# Patient Record
Sex: Female | Born: 1950
Health system: Southern US, Community
[De-identification: ages and names within clinical notes are randomized; demographics above are authoritative.]

## PROBLEM LIST (undated history)

## (undated) DIAGNOSIS — F419 Anxiety disorder, unspecified: Secondary | ICD-10-CM

## (undated) DIAGNOSIS — E119 Type 2 diabetes mellitus without complications: Secondary | ICD-10-CM

## (undated) HISTORY — PX: HAND SURGERY: SHX662

## (undated) HISTORY — PX: BACK SURGERY: SHX140

## (undated) HISTORY — PX: ELBOW SURGERY: SHX618

## (undated) HISTORY — DX: Type 2 diabetes mellitus without complications: E11.9

## (undated) HISTORY — DX: Anxiety disorder, unspecified: F41.9

---

## 2000-03-07 ENCOUNTER — Other Ambulatory Visit: Admission: RE | Admit: 2000-03-07 | Discharge: 2000-03-07 | Payer: Self-pay | Admitting: General Practice

## 2006-04-15 ENCOUNTER — Encounter (INDEPENDENT_AMBULATORY_CARE_PROVIDER_SITE_OTHER): Payer: Self-pay | Admitting: *Deleted

## 2006-04-15 ENCOUNTER — Inpatient Hospital Stay (HOSPITAL_COMMUNITY): Admission: RE | Admit: 2006-04-15 | Discharge: 2006-04-16 | Payer: Self-pay | Admitting: Orthopedic Surgery

## 2007-07-27 ENCOUNTER — Ambulatory Visit (HOSPITAL_COMMUNITY): Admission: RE | Admit: 2007-07-27 | Discharge: 2007-07-28 | Payer: Self-pay | Admitting: Neurosurgery

## 2010-06-05 NOTE — Op Note (Signed)
NAMEJESSIECA, RHEM                  ACCOUNT NO.:  [id number]   MEDICAL RECORD NO.:  [id number]          PATIENT TYPE:  AMB   LOCATION:  SDS                          FACILITY:  [facility]   PHYSICIAN:  Hewitt Shorts, M.D.DATE OF BIRTH:  05/28/1950   DATE OF PROCEDURE:  07/27/2007  DATE OF DISCHARGE:                               OPERATIVE REPORT   PREOPERATIVE DIAGNOSES:  Left L4-L5 lumbar disk herniation, lumbar  degenerative disc disease, lumbar stenosis, and lumbar radiculopathy.   POSTOPERATIVE DIAGNOSES:  Left L4-L5 lumbar disk herniation, lumbar  degenerative disc disease, lumbar stenosis, and lumbar radiculopathy.   PROCEDURE:  Left L4-L5 lumbar laminotomy and microdiscectomy with  microdissection.   SURGEON:  Hewitt Shorts, MD.   ASSISTANT:  1. Dr. Webb Silversmith, NP  2. Dr. Lovell Sheehan   ANESTHESIA:  General endotracheal.   INDICATIONS:  This is a 60 year old woman who presents with a left  lumbar radiculopathy.  An MRI revealed left L4-L5 lumbar disc herniation  with fragments that had migrated caudally behind the body of L5.  Decision was made to proceed with elective laminotomy and  microdiscectomy.   DESCRIPTION OF PROCEDURE:  The patient was brought to the operating  room, placed on the general endotracheal anesthesia.  The patient was  turned to a prone position.  Lumbar region was prepped with Betadine  soap and solution, and draped in a sterile fashion.  The midline was  infiltrated with local anesthetic with epinephrine, and then x-ray was  taken and the L4-L5 identified.  It should be noted that the patient had  a rudimentary S1-S2 disc, and a midline incision was made at the L4-L5  level.  The patient did have a previous midline incision, and the  incision was carried down through that previous incision.   The incision was carried down through the subcutaneous tissue.  Biopolar  cautery and electrocautery was used to maintain hemostasis.  Dissection  was carried  down through the lumbar fascia, which was incised in the  left side of the midline and paraspinal muscles were dissected from the  spinous process and lamina in a subperiosteal fashion.  An x-ray was  taken at the L4-L5, interlaminar space identified.  The microscope was  draped and brought to the field to provide additional navigation,  illumination, and visualization.  Laminotomy decompression was performed  using microdissection and microsurgical technique.   Laminotomy was performed using the X-Max drill and Kerrison punches.  The ligamentum flavum was carefully removed, and we identified the  thecal sac and exiting left L5 nerve root.  These structures were gently  mobilized medially and the disk herniation identified.  We were able to  mobilize the fragment and removed it in initial large fragment, and one  smaller fragment.  We then examined the epidural space and found no  additional fragments.  Repeat compression of the thecal sac and nerve  roots had been achieved.   We then examined the annulus of the L4-L5 disc, which appeared to have  healed over.  We did not find a rent in the annulus, and  therefore  although the disc was degenerated it was not significantly protruding  and was not causing any neural compression.  It was felt there would be  no further drainage, after excising the annulus and entering into the  disk space to proceed with further discectomy.  It was felt that the  decompression had been achieved by removing the free fragment.  We  therefore irrigated the wound with bacitracin solution.  Hemostasis was  established with the use of bipolar cautery.  Once hemostasis was  confirmed, we instilled 2 mL of fentanyl and 80 mg of Depo-Medrol into  the epidural space and proceeded with closure.  The deep fascia was  closed with interrupted undyed #1 Vicryl sutures.  The scarpas fascia  was closed with interrupted inverted 2-0 undyed Vicryl sutures.  The  subcutaneous  and subcuticular were closed with interrupted inverted 2-0  and 3-0 undyed Vicryl sutures.  Skin was reapproximated with Dermabond.  The procedure was tolerated well.  The estimated blood loss was less  than 10 mL.  Sponge and needle count were correct. Following surgery,  the patient was returned back to the supine position, reversed from the  anesthetic, extubated, and transferred to the recovery room for further  care.      Hewitt Shorts, M.D.  Electronically Signed     RWN/MEDQ  D:  07/27/2007  T:  07/28/2007  Job:  [id number]

## 2010-06-08 NOTE — Op Note (Signed)
Sandra Bryan, Sandra Bryan                  ACCOUNT NO.:  [id number]   MEDICAL RECORD NO.:  [id number]          PATIENT TYPE:  AMB   LOCATION:  DAY                          FACILITY:  [facility]   PHYSICIAN:  Marlowe Kays, M.D.  DATE OF BIRTH:  [birth year]   DATE OF PROCEDURE:  04/15/2006  DATE OF DISCHARGE:                               OPERATIVE REPORT   PREOPERATIVE DIAGNOSIS:  Herniated nucleus pulposis, L4-L5 central and  left.   POSTOPERATIVE DIAGNOSIS:  Herniated nucleus pulposis, L4-L5 central and  left.   OPERATION:  Microdiscectomy L4-L5 left.   SURGEON:  Marlowe Kays, M.D.   ASSISTANT:  Georges Lynch. Darrelyn Hillock, M.D.   ANESTHESIA:  General.   JUSTIFICATION FOR PROCEDURE:  The patient has had a history of non-  radiating low back pain in the past but developed low back and left leg  pain around March 03, 2006, and this became progressively more severe  leading to an MRI performed on March 10 which showed the above mentioned  pathology.  She has been in severe pain over the last few days with no  rest that despite a steroid injection at L4-L5 on the left and  consequently, she is here today for the above mentioned surgery.   PROCEDURE:  Prophylactic antibiotics, satisfactory general anesthesia,  knee chest position on the Andrews frame, the back was prepped with  DuraPrep and three spinal needles and a lateral x-ray.  I tentatively  localized the L4-L5 space.  I then completed draping the back in a  sterile field, Ioban employed.  I made a vertical midline incision and  tagged what I initially thought was the L4 spinous process. On further  dissection, it was unclear which was the L3-L4 and which was the L4-L5  interspace and consequently, we placed a Penfield 4 instrument where we  thought was probably L4-L5 and took a third lateral x-ray confirming  that we were at L4-L5 and were able to localize the disc space based on  this x-ray.  I then dissected the soft tissue off the  lamina at L4 and  L5 and placed a self-retaining McCullough retractor.  With a small  curet, I undermined the inferior portion of L4 and superior portion of  L5 and the 2 and 3 mm Kerrison rongeurs began removing bone and  ligamentum flavum. I then brought in the microscope and completed the  exposure removing ligamentum flavum and bone laterally.  The L5 nerve  root was identified and mobilized medially.  A large disc herniation was  noted beneath it.  I opened this with a 15 knife blade and one large  fragment immediately came forth.  I then removed at least three other  large fragments, with one being centrally.  We removed all disc material  obtainable with a combination of angled upbiting curet, straight and  angled upbiting pituitaries.  We then check with a hockey stick at both  L3-L4 and L4-L5 and both appeared to be patent.  A number of fairly  large veins were cauterized with the bipolar cautery.  The wound was dry  on closure.  I placed Gelfoam soaked in thrombin over the interspace and  about the L5 nerve root and dura.  The self-retaining retractors were  removed, there was no unusual bleeding.  I closed the fascia with  interrupted #1 Vicryl along with the deep subcutaneous tissue,  superficial subcutaneous tissue with 2-0 Vicryl, staples in the skin.  I  infiltrated the  subcutaneous tissue with 0.5% Marcaine plain and she was given 30 mg of  Toradol IV.  A Betadine, Adaptic, dry sterile dressing were applied.  She was gently placed on her PACU bed and taken to the PACU in  satisfactory condition with no known complications.           ______________________________  Marlowe Kays, M.D.     JA/MEDQ  D:  04/15/2006  T:  04/15/2006  Job:  [id number]

## 2010-10-18 LAB — BASIC METABOLIC PANEL
BUN: 15
CO2: 28
Calcium: 9.7
Chloride: 105
Creatinine, Ser: 0.77
GFR calc Af Amer: >60
GFR calc non Af Amer: >60
Glucose, Bld: 82
Potassium: 4.4
Sodium: 137

## 2010-10-18 LAB — CBC
HCT: 41.4
Hemoglobin: 14.3
MCHC: 34.4
MCV: 93.1
Platelets: 257
RBC: 4.45
RDW: 12.9
WBC: 10.2

## 2013-06-11 DIAGNOSIS — D0512 Intraductal carcinoma in situ of left breast: Secondary | ICD-10-CM | POA: Insufficient documentation

## 2013-09-16 ENCOUNTER — Ambulatory Visit (INDEPENDENT_AMBULATORY_CARE_PROVIDER_SITE_OTHER): Payer: BC Managed Care – PPO

## 2013-09-16 VITALS — BP 126/87 | HR 83 | Resp 18

## 2013-09-16 DIAGNOSIS — L600 Ingrowing nail: Secondary | ICD-10-CM

## 2013-09-16 DIAGNOSIS — B351 Tinea unguium: Secondary | ICD-10-CM

## 2013-09-16 DIAGNOSIS — L03039 Cellulitis of unspecified toe: Secondary | ICD-10-CM

## 2013-09-16 MED ORDER — CEPHALEXIN 500 MG PO CAPS: 500.0000 mg | ORAL_CAPSULE | Freq: Three times a day (TID) | ORAL | Status: DC

## 2013-09-16 NOTE — Progress Notes (Signed)
   Subjective:    Patient ID: Sandra Bryan, female    DOB: September 15, 1950, 63 y.o.   MRN: [id number]  HPI I AM HAVING SOME TROUBLE WITH THIS INGROWN TOENAIL ON MY LEFT BIG TOE AND I DID GET A PEDICURE AND I HAVE DUG AT IT AND HAS BEEN GOING ON FOR ABOUT 3 DAYS AND THERE IS NO DRAINING AND HURTS REAL BAD AND DOES THROB AND IS SORE AND TENDER AND IS TOUCHY     Review of Systems  Constitutional: Positive for unexpected weight change.  All other systems reviewed and are negative.      Objective:   Physical Exam 63 year old white female well-developed well-nourished oriented x3 presents this time vital signs stable is currently being treated radiation therapy for breast cancer is doing well patient is having pain and infection of her left great toe producing her right great toenail removed the similar problem on the left great toe is thickened yellow criptotic incurvated and painful on direct lateral compression. There may be some evidence of drainage from beneath the nail plate.  Lower extremity objective findings as follows vascular status is intact pedal pulses palpable DP +2/4 PT +2/4 bilateral refill timed 3-4 seconds epicritic and proprioceptive sensations intact and symmetric bilateral normal plantar response DTRs not listed dermatologic the skin color pigment normal hair growth absent nails criptotic right hallux previous in both left hallux with glucose incurvation subungual debris and posse some drainage erythema of the nail folds are noted going on for about 3 or 4 days has had problems with this in the past am a lot since also had a pedicure which may have aggravated this recently. Sore to touch and painful with enclosed shoes. Orthopedic biomechanical exam otherwise unremarkable noncontributory no open wounds or ulcers are degrees noted no ascending cellulitis or lymphangitis noted       Assessment & Plan:  Assessment this time history of ingrowing nail with paronychia and likely  onychomycosis of the left great toenail local anesthetic administered Betadine prep performed the nail plate is avulsed phenol matricectomy followed by alcohol wash Betadine and dressed with dressing but patient how the procedure was given instructions for soaks and Betadine or Epsom salts prescription for cephalexin is furnished recommended Tylenol or Advil as needed for pain recheck in 2-3 weeks for followup contact us in changes or exacerbations were to occur in the interim  Harriet Masson DPM

## 2013-09-16 NOTE — Patient Instructions (Signed)

## 2013-09-16 NOTE — Addendum Note (Signed)
Addended by: Cranford Mon R on: 09/16/2013 01:28 PM   Modules accepted: Orders

## 2013-09-19 LAB — CULTURE, ROUTINE-ABSCESS: Gram Stain: NONE SEEN

## 2013-09-30 ENCOUNTER — Ambulatory Visit: Payer: BC Managed Care – PPO

## 2014-11-10 DIAGNOSIS — K5900 Constipation, unspecified: Secondary | ICD-10-CM

## 2014-11-10 DIAGNOSIS — Z853 Personal history of malignant neoplasm of breast: Secondary | ICD-10-CM

## 2015-04-21 ENCOUNTER — Other Ambulatory Visit: Payer: Self-pay | Admitting: Orthopaedic Surgery

## 2015-04-21 DIAGNOSIS — M5136 Other intervertebral disc degeneration, lumbar region: Secondary | ICD-10-CM

## 2015-04-28 ENCOUNTER — Inpatient Hospital Stay: Admission: RE | Admit: 2015-04-28 | Payer: Self-pay | Source: Ambulatory Visit

## 2015-08-01 DIAGNOSIS — D0512 Intraductal carcinoma in situ of left breast: Secondary | ICD-10-CM | POA: Diagnosis not present

## 2015-08-01 DIAGNOSIS — Z86000 Personal history of in-situ neoplasm of breast: Secondary | ICD-10-CM | POA: Diagnosis not present

## 2015-09-14 DIAGNOSIS — L309 Dermatitis, unspecified: Secondary | ICD-10-CM | POA: Diagnosis not present

## 2015-09-14 DIAGNOSIS — Z13820 Encounter for screening for osteoporosis: Secondary | ICD-10-CM | POA: Diagnosis not present

## 2015-09-14 DIAGNOSIS — M859 Disorder of bone density and structure, unspecified: Secondary | ICD-10-CM | POA: Diagnosis not present

## 2015-09-14 DIAGNOSIS — M8580 Other specified disorders of bone density and structure, unspecified site: Secondary | ICD-10-CM | POA: Diagnosis not present

## 2015-09-14 DIAGNOSIS — E7800 Pure hypercholesterolemia, unspecified: Secondary | ICD-10-CM | POA: Diagnosis not present

## 2015-09-14 DIAGNOSIS — F418 Other specified anxiety disorders: Secondary | ICD-10-CM | POA: Diagnosis not present

## 2015-09-14 DIAGNOSIS — E119 Type 2 diabetes mellitus without complications: Secondary | ICD-10-CM | POA: Diagnosis not present

## 2015-09-14 DIAGNOSIS — Z23 Encounter for immunization: Secondary | ICD-10-CM | POA: Diagnosis not present

## 2015-09-14 DIAGNOSIS — Z9181 History of falling: Secondary | ICD-10-CM | POA: Diagnosis not present

## 2015-09-14 DIAGNOSIS — Z1389 Encounter for screening for other disorder: Secondary | ICD-10-CM | POA: Diagnosis not present

## 2015-09-26 DIAGNOSIS — L219 Seborrheic dermatitis, unspecified: Secondary | ICD-10-CM | POA: Diagnosis not present

## 2015-09-26 DIAGNOSIS — L299 Pruritus, unspecified: Secondary | ICD-10-CM | POA: Diagnosis not present

## 2015-09-26 DIAGNOSIS — L300 Nummular dermatitis: Secondary | ICD-10-CM | POA: Diagnosis not present

## 2016-01-01 DIAGNOSIS — E119 Type 2 diabetes mellitus without complications: Secondary | ICD-10-CM | POA: Diagnosis not present

## 2016-01-01 DIAGNOSIS — M24571 Contracture, right ankle: Secondary | ICD-10-CM | POA: Diagnosis not present

## 2016-01-01 DIAGNOSIS — M722 Plantar fascial fibromatosis: Secondary | ICD-10-CM | POA: Diagnosis not present

## 2016-01-03 DIAGNOSIS — E7800 Pure hypercholesterolemia, unspecified: Secondary | ICD-10-CM | POA: Diagnosis not present

## 2016-01-03 DIAGNOSIS — E119 Type 2 diabetes mellitus without complications: Secondary | ICD-10-CM | POA: Diagnosis not present

## 2016-01-03 DIAGNOSIS — F418 Other specified anxiety disorders: Secondary | ICD-10-CM | POA: Diagnosis not present

## 2016-01-03 DIAGNOSIS — K219 Gastro-esophageal reflux disease without esophagitis: Secondary | ICD-10-CM | POA: Diagnosis not present

## 2016-01-29 DIAGNOSIS — H25813 Combined forms of age-related cataract, bilateral: Secondary | ICD-10-CM | POA: Diagnosis not present

## 2016-01-29 DIAGNOSIS — E119 Type 2 diabetes mellitus without complications: Secondary | ICD-10-CM | POA: Diagnosis not present

## 2016-01-29 DIAGNOSIS — H524 Presbyopia: Secondary | ICD-10-CM | POA: Diagnosis not present

## 2016-01-29 DIAGNOSIS — H1851 Endothelial corneal dystrophy: Secondary | ICD-10-CM | POA: Diagnosis not present

## 2016-01-29 DIAGNOSIS — H5203 Hypermetropia, bilateral: Secondary | ICD-10-CM | POA: Diagnosis not present

## 2016-01-29 DIAGNOSIS — H52223 Regular astigmatism, bilateral: Secondary | ICD-10-CM | POA: Diagnosis not present

## 2016-01-29 DIAGNOSIS — Z7984 Long term (current) use of oral hypoglycemic drugs: Secondary | ICD-10-CM | POA: Diagnosis not present

## 2016-02-05 DIAGNOSIS — Z86000 Personal history of in-situ neoplasm of breast: Secondary | ICD-10-CM | POA: Diagnosis not present

## 2016-03-01 DIAGNOSIS — E119 Type 2 diabetes mellitus without complications: Secondary | ICD-10-CM | POA: Diagnosis not present

## 2016-03-29 DIAGNOSIS — E119 Type 2 diabetes mellitus without complications: Secondary | ICD-10-CM | POA: Diagnosis not present

## 2016-03-29 DIAGNOSIS — I10 Essential (primary) hypertension: Secondary | ICD-10-CM | POA: Diagnosis not present

## 2016-04-29 DIAGNOSIS — C50112 Malignant neoplasm of central portion of left female breast: Secondary | ICD-10-CM | POA: Diagnosis not present

## 2016-04-29 DIAGNOSIS — R928 Other abnormal and inconclusive findings on diagnostic imaging of breast: Secondary | ICD-10-CM | POA: Diagnosis not present

## 2016-04-29 DIAGNOSIS — Z9889 Other specified postprocedural states: Secondary | ICD-10-CM | POA: Diagnosis not present

## 2016-07-05 DIAGNOSIS — G8929 Other chronic pain: Secondary | ICD-10-CM | POA: Diagnosis not present

## 2016-07-05 DIAGNOSIS — E119 Type 2 diabetes mellitus without complications: Secondary | ICD-10-CM | POA: Diagnosis not present

## 2016-07-05 DIAGNOSIS — I10 Essential (primary) hypertension: Secondary | ICD-10-CM | POA: Diagnosis not present

## 2016-07-05 DIAGNOSIS — M542 Cervicalgia: Secondary | ICD-10-CM | POA: Diagnosis not present

## 2016-08-05 DIAGNOSIS — Z86000 Personal history of in-situ neoplasm of breast: Secondary | ICD-10-CM | POA: Diagnosis not present

## 2016-08-05 DIAGNOSIS — Z170 Estrogen receptor positive status [ER+]: Secondary | ICD-10-CM | POA: Diagnosis not present

## 2016-08-05 DIAGNOSIS — Z923 Personal history of irradiation: Secondary | ICD-10-CM | POA: Diagnosis not present

## 2016-08-12 DIAGNOSIS — E119 Type 2 diabetes mellitus without complications: Secondary | ICD-10-CM | POA: Diagnosis not present

## 2016-08-12 DIAGNOSIS — Z0000 Encounter for general adult medical examination without abnormal findings: Secondary | ICD-10-CM | POA: Diagnosis not present

## 2016-08-12 DIAGNOSIS — R079 Chest pain, unspecified: Secondary | ICD-10-CM | POA: Diagnosis not present

## 2016-08-12 DIAGNOSIS — I10 Essential (primary) hypertension: Secondary | ICD-10-CM | POA: Diagnosis not present

## 2016-08-12 DIAGNOSIS — Z6835 Body mass index (BMI) 35.0-35.9, adult: Secondary | ICD-10-CM | POA: Diagnosis not present

## 2016-08-14 DIAGNOSIS — E119 Type 2 diabetes mellitus without complications: Secondary | ICD-10-CM | POA: Diagnosis not present

## 2016-08-19 DIAGNOSIS — H1851 Endothelial corneal dystrophy: Secondary | ICD-10-CM | POA: Diagnosis not present

## 2016-08-19 DIAGNOSIS — H25813 Combined forms of age-related cataract, bilateral: Secondary | ICD-10-CM | POA: Diagnosis not present

## 2016-09-19 DIAGNOSIS — E119 Type 2 diabetes mellitus without complications: Secondary | ICD-10-CM | POA: Diagnosis not present

## 2016-09-19 DIAGNOSIS — Z6834 Body mass index (BMI) 34.0-34.9, adult: Secondary | ICD-10-CM | POA: Diagnosis not present

## 2016-09-19 DIAGNOSIS — Z9181 History of falling: Secondary | ICD-10-CM | POA: Diagnosis not present

## 2016-09-19 DIAGNOSIS — Z23 Encounter for immunization: Secondary | ICD-10-CM | POA: Diagnosis not present

## 2016-10-16 DIAGNOSIS — L300 Nummular dermatitis: Secondary | ICD-10-CM | POA: Diagnosis not present

## 2016-12-20 DIAGNOSIS — Z1331 Encounter for screening for depression: Secondary | ICD-10-CM | POA: Diagnosis not present

## 2016-12-20 DIAGNOSIS — Z6835 Body mass index (BMI) 35.0-35.9, adult: Secondary | ICD-10-CM | POA: Diagnosis not present

## 2016-12-20 DIAGNOSIS — Z1339 Encounter for screening examination for other mental health and behavioral disorders: Secondary | ICD-10-CM | POA: Diagnosis not present

## 2016-12-20 DIAGNOSIS — Z23 Encounter for immunization: Secondary | ICD-10-CM | POA: Diagnosis not present

## 2016-12-20 DIAGNOSIS — E119 Type 2 diabetes mellitus without complications: Secondary | ICD-10-CM | POA: Diagnosis not present

## 2016-12-20 DIAGNOSIS — E7800 Pure hypercholesterolemia, unspecified: Secondary | ICD-10-CM | POA: Diagnosis not present

## 2016-12-20 DIAGNOSIS — F418 Other specified anxiety disorders: Secondary | ICD-10-CM | POA: Diagnosis not present

## 2016-12-20 DIAGNOSIS — M25522 Pain in left elbow: Secondary | ICD-10-CM | POA: Diagnosis not present

## 2017-01-01 DIAGNOSIS — M7712 Lateral epicondylitis, left elbow: Secondary | ICD-10-CM | POA: Diagnosis not present

## 2017-01-30 DIAGNOSIS — H40053 Ocular hypertension, bilateral: Secondary | ICD-10-CM | POA: Diagnosis not present

## 2017-01-30 DIAGNOSIS — Z0100 Encounter for examination of eyes and vision without abnormal findings: Secondary | ICD-10-CM | POA: Diagnosis not present

## 2017-01-30 DIAGNOSIS — H5203 Hypermetropia, bilateral: Secondary | ICD-10-CM | POA: Diagnosis not present

## 2017-02-04 DIAGNOSIS — M25561 Pain in right knee: Secondary | ICD-10-CM | POA: Diagnosis not present

## 2017-02-11 DIAGNOSIS — E119 Type 2 diabetes mellitus without complications: Secondary | ICD-10-CM | POA: Diagnosis not present

## 2017-02-11 DIAGNOSIS — I10 Essential (primary) hypertension: Secondary | ICD-10-CM | POA: Diagnosis not present

## 2017-02-11 DIAGNOSIS — Z1211 Encounter for screening for malignant neoplasm of colon: Secondary | ICD-10-CM | POA: Diagnosis not present

## 2017-02-11 DIAGNOSIS — Z6835 Body mass index (BMI) 35.0-35.9, adult: Secondary | ICD-10-CM | POA: Diagnosis not present

## 2017-02-11 DIAGNOSIS — Z79899 Other long term (current) drug therapy: Secondary | ICD-10-CM | POA: Diagnosis not present

## 2017-02-11 DIAGNOSIS — E7800 Pure hypercholesterolemia, unspecified: Secondary | ICD-10-CM | POA: Diagnosis not present

## 2017-02-11 DIAGNOSIS — Z1331 Encounter for screening for depression: Secondary | ICD-10-CM | POA: Diagnosis not present

## 2017-02-12 DIAGNOSIS — M25561 Pain in right knee: Secondary | ICD-10-CM | POA: Diagnosis not present

## 2017-02-12 DIAGNOSIS — M1711 Unilateral primary osteoarthritis, right knee: Secondary | ICD-10-CM | POA: Diagnosis not present

## 2017-03-17 DIAGNOSIS — E119 Type 2 diabetes mellitus without complications: Secondary | ICD-10-CM | POA: Diagnosis not present

## 2017-03-17 DIAGNOSIS — Z6834 Body mass index (BMI) 34.0-34.9, adult: Secondary | ICD-10-CM | POA: Diagnosis not present

## 2017-03-19 DIAGNOSIS — Z9181 History of falling: Secondary | ICD-10-CM | POA: Diagnosis not present

## 2017-03-19 DIAGNOSIS — Z1331 Encounter for screening for depression: Secondary | ICD-10-CM | POA: Diagnosis not present

## 2017-03-19 DIAGNOSIS — E669 Obesity, unspecified: Secondary | ICD-10-CM | POA: Diagnosis not present

## 2017-03-19 DIAGNOSIS — Z6835 Body mass index (BMI) 35.0-35.9, adult: Secondary | ICD-10-CM | POA: Diagnosis not present

## 2017-03-19 DIAGNOSIS — Z136 Encounter for screening for cardiovascular disorders: Secondary | ICD-10-CM | POA: Diagnosis not present

## 2017-03-19 DIAGNOSIS — Z0000 Encounter for general adult medical examination without abnormal findings: Secondary | ICD-10-CM | POA: Diagnosis not present

## 2017-03-19 DIAGNOSIS — E785 Hyperlipidemia, unspecified: Secondary | ICD-10-CM | POA: Diagnosis not present

## 2017-03-19 DIAGNOSIS — Z1231 Encounter for screening mammogram for malignant neoplasm of breast: Secondary | ICD-10-CM | POA: Diagnosis not present

## 2017-03-19 DIAGNOSIS — N959 Unspecified menopausal and perimenopausal disorder: Secondary | ICD-10-CM | POA: Diagnosis not present

## 2017-03-19 DIAGNOSIS — Z1211 Encounter for screening for malignant neoplasm of colon: Secondary | ICD-10-CM | POA: Diagnosis not present

## 2017-03-27 DIAGNOSIS — H40053 Ocular hypertension, bilateral: Secondary | ICD-10-CM | POA: Diagnosis not present

## 2017-04-30 DIAGNOSIS — Z6834 Body mass index (BMI) 34.0-34.9, adult: Secondary | ICD-10-CM | POA: Diagnosis not present

## 2017-04-30 DIAGNOSIS — E119 Type 2 diabetes mellitus without complications: Secondary | ICD-10-CM | POA: Diagnosis not present

## 2017-04-30 DIAGNOSIS — R109 Unspecified abdominal pain: Secondary | ICD-10-CM | POA: Diagnosis not present

## 2017-05-01 DIAGNOSIS — N281 Cyst of kidney, acquired: Secondary | ICD-10-CM | POA: Diagnosis not present

## 2017-05-01 DIAGNOSIS — C50112 Malignant neoplasm of central portion of left female breast: Secondary | ICD-10-CM | POA: Diagnosis not present

## 2017-05-01 DIAGNOSIS — R109 Unspecified abdominal pain: Secondary | ICD-10-CM | POA: Diagnosis not present

## 2017-05-01 DIAGNOSIS — K760 Fatty (change of) liver, not elsewhere classified: Secondary | ICD-10-CM | POA: Diagnosis not present

## 2017-05-01 DIAGNOSIS — N200 Calculus of kidney: Secondary | ICD-10-CM | POA: Diagnosis not present

## 2017-05-08 DIAGNOSIS — R928 Other abnormal and inconclusive findings on diagnostic imaging of breast: Secondary | ICD-10-CM | POA: Diagnosis not present

## 2017-05-20 DIAGNOSIS — F329 Major depressive disorder, single episode, unspecified: Secondary | ICD-10-CM | POA: Diagnosis not present

## 2017-05-20 DIAGNOSIS — I10 Essential (primary) hypertension: Secondary | ICD-10-CM | POA: Diagnosis not present

## 2017-05-20 DIAGNOSIS — E119 Type 2 diabetes mellitus without complications: Secondary | ICD-10-CM | POA: Diagnosis not present

## 2017-05-20 DIAGNOSIS — E7800 Pure hypercholesterolemia, unspecified: Secondary | ICD-10-CM | POA: Diagnosis not present

## 2017-05-22 ENCOUNTER — Encounter: Payer: Self-pay | Admitting: Nurse Practitioner

## 2017-05-30 DIAGNOSIS — Z86000 Personal history of in-situ neoplasm of breast: Secondary | ICD-10-CM | POA: Diagnosis not present

## 2017-06-02 DIAGNOSIS — L603 Nail dystrophy: Secondary | ICD-10-CM | POA: Diagnosis not present

## 2017-06-02 DIAGNOSIS — L600 Ingrowing nail: Secondary | ICD-10-CM | POA: Diagnosis not present

## 2017-06-11 DIAGNOSIS — L603 Nail dystrophy: Secondary | ICD-10-CM | POA: Diagnosis not present

## 2017-06-11 DIAGNOSIS — L600 Ingrowing nail: Secondary | ICD-10-CM | POA: Diagnosis not present

## 2017-06-25 DIAGNOSIS — L600 Ingrowing nail: Secondary | ICD-10-CM | POA: Diagnosis not present

## 2017-08-06 DIAGNOSIS — L739 Follicular disorder, unspecified: Secondary | ICD-10-CM | POA: Diagnosis not present

## 2017-08-06 DIAGNOSIS — K219 Gastro-esophageal reflux disease without esophagitis: Secondary | ICD-10-CM | POA: Diagnosis not present

## 2017-08-06 DIAGNOSIS — E785 Hyperlipidemia, unspecified: Secondary | ICD-10-CM | POA: Diagnosis not present

## 2017-08-06 DIAGNOSIS — F329 Major depressive disorder, single episode, unspecified: Secondary | ICD-10-CM | POA: Diagnosis not present

## 2017-08-06 DIAGNOSIS — E119 Type 2 diabetes mellitus without complications: Secondary | ICD-10-CM | POA: Diagnosis not present

## 2017-08-22 DIAGNOSIS — F329 Major depressive disorder, single episode, unspecified: Secondary | ICD-10-CM | POA: Diagnosis not present

## 2017-08-22 DIAGNOSIS — I10 Essential (primary) hypertension: Secondary | ICD-10-CM | POA: Diagnosis not present

## 2017-08-22 DIAGNOSIS — E119 Type 2 diabetes mellitus without complications: Secondary | ICD-10-CM | POA: Diagnosis not present

## 2017-08-22 DIAGNOSIS — Z79899 Other long term (current) drug therapy: Secondary | ICD-10-CM | POA: Diagnosis not present

## 2017-08-22 DIAGNOSIS — E7800 Pure hypercholesterolemia, unspecified: Secondary | ICD-10-CM | POA: Diagnosis not present

## 2017-10-03 DIAGNOSIS — M546 Pain in thoracic spine: Secondary | ICD-10-CM | POA: Diagnosis not present

## 2017-10-03 DIAGNOSIS — E119 Type 2 diabetes mellitus without complications: Secondary | ICD-10-CM | POA: Diagnosis not present

## 2017-10-03 DIAGNOSIS — F419 Anxiety disorder, unspecified: Secondary | ICD-10-CM | POA: Diagnosis not present

## 2017-10-03 DIAGNOSIS — Z87891 Personal history of nicotine dependence: Secondary | ICD-10-CM | POA: Diagnosis not present

## 2017-10-06 DIAGNOSIS — M546 Pain in thoracic spine: Secondary | ICD-10-CM | POA: Diagnosis not present

## 2017-10-06 DIAGNOSIS — M4184 Other forms of scoliosis, thoracic region: Secondary | ICD-10-CM | POA: Diagnosis not present

## 2017-10-15 DIAGNOSIS — H40053 Ocular hypertension, bilateral: Secondary | ICD-10-CM | POA: Diagnosis not present

## 2017-10-15 DIAGNOSIS — H25813 Combined forms of age-related cataract, bilateral: Secondary | ICD-10-CM | POA: Diagnosis not present

## 2017-11-03 DIAGNOSIS — Z23 Encounter for immunization: Secondary | ICD-10-CM | POA: Diagnosis not present

## 2017-11-03 DIAGNOSIS — E119 Type 2 diabetes mellitus without complications: Secondary | ICD-10-CM | POA: Diagnosis not present

## 2017-11-03 DIAGNOSIS — F419 Anxiety disorder, unspecified: Secondary | ICD-10-CM | POA: Diagnosis not present

## 2017-11-03 DIAGNOSIS — Z6834 Body mass index (BMI) 34.0-34.9, adult: Secondary | ICD-10-CM | POA: Diagnosis not present

## 2017-11-26 DIAGNOSIS — I10 Essential (primary) hypertension: Secondary | ICD-10-CM | POA: Diagnosis not present

## 2017-11-26 DIAGNOSIS — F419 Anxiety disorder, unspecified: Secondary | ICD-10-CM | POA: Diagnosis not present

## 2017-11-26 DIAGNOSIS — E119 Type 2 diabetes mellitus without complications: Secondary | ICD-10-CM | POA: Diagnosis not present

## 2017-11-26 DIAGNOSIS — E7800 Pure hypercholesterolemia, unspecified: Secondary | ICD-10-CM | POA: Diagnosis not present

## 2017-11-26 DIAGNOSIS — J069 Acute upper respiratory infection, unspecified: Secondary | ICD-10-CM | POA: Diagnosis not present

## 2018-03-03 DIAGNOSIS — Z6834 Body mass index (BMI) 34.0-34.9, adult: Secondary | ICD-10-CM | POA: Diagnosis not present

## 2018-03-03 DIAGNOSIS — F419 Anxiety disorder, unspecified: Secondary | ICD-10-CM | POA: Diagnosis not present

## 2018-03-03 DIAGNOSIS — K219 Gastro-esophageal reflux disease without esophagitis: Secondary | ICD-10-CM | POA: Diagnosis not present

## 2018-03-03 DIAGNOSIS — E119 Type 2 diabetes mellitus without complications: Secondary | ICD-10-CM | POA: Diagnosis not present

## 2018-03-03 DIAGNOSIS — E7800 Pure hypercholesterolemia, unspecified: Secondary | ICD-10-CM | POA: Diagnosis not present

## 2018-04-02 DIAGNOSIS — Z79899 Other long term (current) drug therapy: Secondary | ICD-10-CM | POA: Diagnosis not present

## 2018-04-20 DIAGNOSIS — E119 Type 2 diabetes mellitus without complications: Secondary | ICD-10-CM | POA: Diagnosis not present

## 2018-04-20 DIAGNOSIS — B379 Candidiasis, unspecified: Secondary | ICD-10-CM | POA: Diagnosis not present

## 2018-05-11 DIAGNOSIS — Z853 Personal history of malignant neoplasm of breast: Secondary | ICD-10-CM | POA: Diagnosis not present

## 2018-05-11 DIAGNOSIS — R928 Other abnormal and inconclusive findings on diagnostic imaging of breast: Secondary | ICD-10-CM | POA: Diagnosis not present

## 2018-06-01 DIAGNOSIS — E119 Type 2 diabetes mellitus without complications: Secondary | ICD-10-CM | POA: Diagnosis not present

## 2018-06-01 DIAGNOSIS — Z1331 Encounter for screening for depression: Secondary | ICD-10-CM | POA: Diagnosis not present

## 2018-06-01 DIAGNOSIS — K219 Gastro-esophageal reflux disease without esophagitis: Secondary | ICD-10-CM | POA: Diagnosis not present

## 2018-06-01 DIAGNOSIS — F419 Anxiety disorder, unspecified: Secondary | ICD-10-CM | POA: Diagnosis not present

## 2018-06-01 DIAGNOSIS — E7800 Pure hypercholesterolemia, unspecified: Secondary | ICD-10-CM | POA: Diagnosis not present

## 2018-06-02 DIAGNOSIS — E7800 Pure hypercholesterolemia, unspecified: Secondary | ICD-10-CM | POA: Diagnosis not present

## 2018-06-02 DIAGNOSIS — E119 Type 2 diabetes mellitus without complications: Secondary | ICD-10-CM | POA: Diagnosis not present

## 2018-07-01 DIAGNOSIS — E785 Hyperlipidemia, unspecified: Secondary | ICD-10-CM | POA: Diagnosis not present

## 2018-07-01 DIAGNOSIS — Z1211 Encounter for screening for malignant neoplasm of colon: Secondary | ICD-10-CM | POA: Diagnosis not present

## 2018-07-01 DIAGNOSIS — Z139 Encounter for screening, unspecified: Secondary | ICD-10-CM | POA: Diagnosis not present

## 2018-07-01 DIAGNOSIS — Z6834 Body mass index (BMI) 34.0-34.9, adult: Secondary | ICD-10-CM | POA: Diagnosis not present

## 2018-07-01 DIAGNOSIS — Z9181 History of falling: Secondary | ICD-10-CM | POA: Diagnosis not present

## 2018-07-01 DIAGNOSIS — N959 Unspecified menopausal and perimenopausal disorder: Secondary | ICD-10-CM | POA: Diagnosis not present

## 2018-07-01 DIAGNOSIS — Z1231 Encounter for screening mammogram for malignant neoplasm of breast: Secondary | ICD-10-CM | POA: Diagnosis not present

## 2018-07-01 DIAGNOSIS — Z1339 Encounter for screening examination for other mental health and behavioral disorders: Secondary | ICD-10-CM | POA: Diagnosis not present

## 2018-07-01 DIAGNOSIS — Z0000 Encounter for general adult medical examination without abnormal findings: Secondary | ICD-10-CM | POA: Diagnosis not present

## 2018-07-01 DIAGNOSIS — Z1331 Encounter for screening for depression: Secondary | ICD-10-CM | POA: Diagnosis not present

## 2018-07-14 DIAGNOSIS — E119 Type 2 diabetes mellitus without complications: Secondary | ICD-10-CM | POA: Diagnosis not present

## 2018-07-14 DIAGNOSIS — R238 Other skin changes: Secondary | ICD-10-CM | POA: Diagnosis not present

## 2018-07-31 DIAGNOSIS — H61899 Other specified disorders of external ear, unspecified ear: Secondary | ICD-10-CM | POA: Diagnosis not present

## 2018-07-31 DIAGNOSIS — H60549 Acute eczematoid otitis externa, unspecified ear: Secondary | ICD-10-CM | POA: Diagnosis not present

## 2018-07-31 DIAGNOSIS — L299 Pruritus, unspecified: Secondary | ICD-10-CM | POA: Diagnosis not present

## 2018-09-01 DIAGNOSIS — M5432 Sciatica, left side: Secondary | ICD-10-CM | POA: Diagnosis not present

## 2018-09-01 DIAGNOSIS — E119 Type 2 diabetes mellitus without complications: Secondary | ICD-10-CM | POA: Diagnosis not present

## 2018-09-01 DIAGNOSIS — H9213 Otorrhea, bilateral: Secondary | ICD-10-CM | POA: Diagnosis not present

## 2018-09-01 DIAGNOSIS — E7800 Pure hypercholesterolemia, unspecified: Secondary | ICD-10-CM | POA: Diagnosis not present

## 2018-09-01 DIAGNOSIS — F329 Major depressive disorder, single episode, unspecified: Secondary | ICD-10-CM | POA: Diagnosis not present

## 2018-09-01 DIAGNOSIS — K219 Gastro-esophageal reflux disease without esophagitis: Secondary | ICD-10-CM | POA: Diagnosis not present

## 2018-09-01 DIAGNOSIS — F419 Anxiety disorder, unspecified: Secondary | ICD-10-CM | POA: Diagnosis not present

## 2018-10-07 DIAGNOSIS — Z170 Estrogen receptor positive status [ER+]: Secondary | ICD-10-CM | POA: Diagnosis not present

## 2018-10-07 DIAGNOSIS — C50819 Malignant neoplasm of overlapping sites of unspecified female breast: Secondary | ICD-10-CM | POA: Diagnosis not present

## 2018-10-07 DIAGNOSIS — D0512 Intraductal carcinoma in situ of left breast: Secondary | ICD-10-CM | POA: Diagnosis not present

## 2018-10-07 DIAGNOSIS — R442 Other hallucinations: Secondary | ICD-10-CM | POA: Diagnosis not present

## 2018-10-07 DIAGNOSIS — Z86000 Personal history of in-situ neoplasm of breast: Secondary | ICD-10-CM | POA: Diagnosis not present

## 2018-10-08 DIAGNOSIS — K219 Gastro-esophageal reflux disease without esophagitis: Secondary | ICD-10-CM | POA: Diagnosis not present

## 2018-10-08 DIAGNOSIS — Z86010 Personal history of colonic polyps: Secondary | ICD-10-CM | POA: Diagnosis not present

## 2018-10-21 DIAGNOSIS — E119 Type 2 diabetes mellitus without complications: Secondary | ICD-10-CM | POA: Diagnosis not present

## 2018-10-21 DIAGNOSIS — H527 Unspecified disorder of refraction: Secondary | ICD-10-CM | POA: Diagnosis not present

## 2018-10-21 DIAGNOSIS — H25813 Combined forms of age-related cataract, bilateral: Secondary | ICD-10-CM | POA: Diagnosis not present

## 2018-10-21 DIAGNOSIS — H04123 Dry eye syndrome of bilateral lacrimal glands: Secondary | ICD-10-CM | POA: Diagnosis not present

## 2018-10-30 DIAGNOSIS — Z86010 Personal history of colonic polyps: Secondary | ICD-10-CM | POA: Diagnosis not present

## 2018-10-30 DIAGNOSIS — K219 Gastro-esophageal reflux disease without esophagitis: Secondary | ICD-10-CM | POA: Diagnosis not present

## 2018-10-30 DIAGNOSIS — E119 Type 2 diabetes mellitus without complications: Secondary | ICD-10-CM | POA: Diagnosis not present

## 2018-10-30 DIAGNOSIS — K5900 Constipation, unspecified: Secondary | ICD-10-CM | POA: Diagnosis not present

## 2018-10-30 DIAGNOSIS — Z09 Encounter for follow-up examination after completed treatment for conditions other than malignant neoplasm: Secondary | ICD-10-CM | POA: Diagnosis not present

## 2018-10-30 DIAGNOSIS — Z79899 Other long term (current) drug therapy: Secondary | ICD-10-CM | POA: Diagnosis not present

## 2018-10-30 DIAGNOSIS — Z853 Personal history of malignant neoplasm of breast: Secondary | ICD-10-CM | POA: Diagnosis not present

## 2018-10-30 DIAGNOSIS — I10 Essential (primary) hypertension: Secondary | ICD-10-CM | POA: Diagnosis not present

## 2018-11-24 DIAGNOSIS — M5416 Radiculopathy, lumbar region: Secondary | ICD-10-CM | POA: Diagnosis not present

## 2018-11-24 DIAGNOSIS — Z23 Encounter for immunization: Secondary | ICD-10-CM | POA: Diagnosis not present

## 2018-11-30 DIAGNOSIS — M4726 Other spondylosis with radiculopathy, lumbar region: Secondary | ICD-10-CM | POA: Diagnosis not present

## 2018-11-30 DIAGNOSIS — M5116 Intervertebral disc disorders with radiculopathy, lumbar region: Secondary | ICD-10-CM | POA: Diagnosis not present

## 2018-11-30 DIAGNOSIS — M5416 Radiculopathy, lumbar region: Secondary | ICD-10-CM | POA: Diagnosis not present

## 2018-12-02 DIAGNOSIS — E7800 Pure hypercholesterolemia, unspecified: Secondary | ICD-10-CM | POA: Diagnosis not present

## 2018-12-02 DIAGNOSIS — E119 Type 2 diabetes mellitus without complications: Secondary | ICD-10-CM | POA: Diagnosis not present

## 2018-12-02 DIAGNOSIS — F419 Anxiety disorder, unspecified: Secondary | ICD-10-CM | POA: Diagnosis not present

## 2018-12-02 DIAGNOSIS — M5416 Radiculopathy, lumbar region: Secondary | ICD-10-CM | POA: Diagnosis not present

## 2018-12-02 DIAGNOSIS — F329 Major depressive disorder, single episode, unspecified: Secondary | ICD-10-CM | POA: Diagnosis not present

## 2018-12-02 DIAGNOSIS — K219 Gastro-esophageal reflux disease without esophagitis: Secondary | ICD-10-CM | POA: Diagnosis not present

## 2019-01-06 DIAGNOSIS — M545 Low back pain: Secondary | ICD-10-CM | POA: Diagnosis not present

## 2019-01-06 DIAGNOSIS — M7061 Trochanteric bursitis, right hip: Secondary | ICD-10-CM | POA: Diagnosis not present

## 2019-01-06 DIAGNOSIS — Z6832 Body mass index (BMI) 32.0-32.9, adult: Secondary | ICD-10-CM | POA: Diagnosis not present

## 2019-01-06 DIAGNOSIS — M5136 Other intervertebral disc degeneration, lumbar region: Secondary | ICD-10-CM | POA: Diagnosis not present

## 2019-01-18 DIAGNOSIS — M5136 Other intervertebral disc degeneration, lumbar region: Secondary | ICD-10-CM | POA: Diagnosis not present

## 2019-02-02 DIAGNOSIS — M5136 Other intervertebral disc degeneration, lumbar region: Secondary | ICD-10-CM | POA: Diagnosis not present

## 2019-03-04 DIAGNOSIS — K219 Gastro-esophageal reflux disease without esophagitis: Secondary | ICD-10-CM | POA: Diagnosis not present

## 2019-03-04 DIAGNOSIS — F419 Anxiety disorder, unspecified: Secondary | ICD-10-CM | POA: Diagnosis not present

## 2019-03-04 DIAGNOSIS — E7800 Pure hypercholesterolemia, unspecified: Secondary | ICD-10-CM | POA: Diagnosis not present

## 2019-03-04 DIAGNOSIS — Z79899 Other long term (current) drug therapy: Secondary | ICD-10-CM | POA: Diagnosis not present

## 2019-03-04 DIAGNOSIS — F329 Major depressive disorder, single episode, unspecified: Secondary | ICD-10-CM | POA: Diagnosis not present

## 2019-03-04 DIAGNOSIS — E119 Type 2 diabetes mellitus without complications: Secondary | ICD-10-CM | POA: Diagnosis not present

## 2019-03-10 DIAGNOSIS — M5136 Other intervertebral disc degeneration, lumbar region: Secondary | ICD-10-CM | POA: Diagnosis not present

## 2019-04-02 DIAGNOSIS — M7061 Trochanteric bursitis, right hip: Secondary | ICD-10-CM | POA: Diagnosis not present

## 2019-04-02 DIAGNOSIS — M47816 Spondylosis without myelopathy or radiculopathy, lumbar region: Secondary | ICD-10-CM | POA: Diagnosis not present

## 2019-04-02 DIAGNOSIS — M5136 Other intervertebral disc degeneration, lumbar region: Secondary | ICD-10-CM | POA: Diagnosis not present

## 2019-04-12 DIAGNOSIS — M47817 Spondylosis without myelopathy or radiculopathy, lumbosacral region: Secondary | ICD-10-CM | POA: Diagnosis not present

## 2019-05-03 DIAGNOSIS — M47816 Spondylosis without myelopathy or radiculopathy, lumbar region: Secondary | ICD-10-CM | POA: Diagnosis not present

## 2019-05-11 DIAGNOSIS — G894 Chronic pain syndrome: Secondary | ICD-10-CM | POA: Diagnosis not present

## 2019-05-11 DIAGNOSIS — M545 Low back pain: Secondary | ICD-10-CM | POA: Diagnosis not present

## 2019-05-11 DIAGNOSIS — M47816 Spondylosis without myelopathy or radiculopathy, lumbar region: Secondary | ICD-10-CM | POA: Diagnosis not present

## 2019-05-11 DIAGNOSIS — Z79899 Other long term (current) drug therapy: Secondary | ICD-10-CM | POA: Diagnosis not present

## 2019-05-11 DIAGNOSIS — Z79891 Long term (current) use of opiate analgesic: Secondary | ICD-10-CM | POA: Diagnosis not present

## 2019-05-11 DIAGNOSIS — M5416 Radiculopathy, lumbar region: Secondary | ICD-10-CM | POA: Diagnosis not present

## 2019-05-18 DIAGNOSIS — M47816 Spondylosis without myelopathy or radiculopathy, lumbar region: Secondary | ICD-10-CM | POA: Diagnosis not present

## 2019-05-21 DIAGNOSIS — N649 Disorder of breast, unspecified: Secondary | ICD-10-CM | POA: Diagnosis not present

## 2019-05-21 DIAGNOSIS — Z853 Personal history of malignant neoplasm of breast: Secondary | ICD-10-CM | POA: Diagnosis not present

## 2019-05-21 DIAGNOSIS — R928 Other abnormal and inconclusive findings on diagnostic imaging of breast: Secondary | ICD-10-CM | POA: Diagnosis not present

## 2019-05-31 DIAGNOSIS — Z86000 Personal history of in-situ neoplasm of breast: Secondary | ICD-10-CM | POA: Diagnosis not present

## 2019-06-01 DIAGNOSIS — F419 Anxiety disorder, unspecified: Secondary | ICD-10-CM | POA: Diagnosis not present

## 2019-06-01 DIAGNOSIS — K219 Gastro-esophageal reflux disease without esophagitis: Secondary | ICD-10-CM | POA: Diagnosis not present

## 2019-06-01 DIAGNOSIS — E119 Type 2 diabetes mellitus without complications: Secondary | ICD-10-CM | POA: Diagnosis not present

## 2019-06-01 DIAGNOSIS — F329 Major depressive disorder, single episode, unspecified: Secondary | ICD-10-CM | POA: Diagnosis not present

## 2019-06-01 DIAGNOSIS — E7800 Pure hypercholesterolemia, unspecified: Secondary | ICD-10-CM | POA: Diagnosis not present

## 2019-06-15 DIAGNOSIS — M545 Low back pain: Secondary | ICD-10-CM | POA: Diagnosis not present

## 2019-06-15 DIAGNOSIS — Z6832 Body mass index (BMI) 32.0-32.9, adult: Secondary | ICD-10-CM | POA: Diagnosis not present

## 2019-06-15 DIAGNOSIS — I10 Essential (primary) hypertension: Secondary | ICD-10-CM | POA: Diagnosis not present

## 2019-06-15 DIAGNOSIS — M47816 Spondylosis without myelopathy or radiculopathy, lumbar region: Secondary | ICD-10-CM | POA: Diagnosis not present

## 2019-06-28 DIAGNOSIS — M5136 Other intervertebral disc degeneration, lumbar region: Secondary | ICD-10-CM | POA: Diagnosis not present

## 2019-07-08 DIAGNOSIS — Z0000 Encounter for general adult medical examination without abnormal findings: Secondary | ICD-10-CM | POA: Diagnosis not present

## 2019-07-08 DIAGNOSIS — Z139 Encounter for screening, unspecified: Secondary | ICD-10-CM | POA: Diagnosis not present

## 2019-07-08 DIAGNOSIS — Z9181 History of falling: Secondary | ICD-10-CM | POA: Diagnosis not present

## 2019-07-08 DIAGNOSIS — E785 Hyperlipidemia, unspecified: Secondary | ICD-10-CM | POA: Diagnosis not present

## 2019-07-08 DIAGNOSIS — Z1331 Encounter for screening for depression: Secondary | ICD-10-CM | POA: Diagnosis not present

## 2019-07-08 DIAGNOSIS — Z6834 Body mass index (BMI) 34.0-34.9, adult: Secondary | ICD-10-CM | POA: Diagnosis not present

## 2019-07-16 DIAGNOSIS — Z13820 Encounter for screening for osteoporosis: Secondary | ICD-10-CM | POA: Diagnosis not present

## 2019-07-16 DIAGNOSIS — N959 Unspecified menopausal and perimenopausal disorder: Secondary | ICD-10-CM | POA: Diagnosis not present

## 2019-07-19 DIAGNOSIS — Z6832 Body mass index (BMI) 32.0-32.9, adult: Secondary | ICD-10-CM | POA: Diagnosis not present

## 2019-07-19 DIAGNOSIS — M47816 Spondylosis without myelopathy or radiculopathy, lumbar region: Secondary | ICD-10-CM | POA: Diagnosis not present

## 2019-07-19 DIAGNOSIS — I10 Essential (primary) hypertension: Secondary | ICD-10-CM | POA: Diagnosis not present

## 2019-07-19 DIAGNOSIS — M5136 Other intervertebral disc degeneration, lumbar region: Secondary | ICD-10-CM | POA: Diagnosis not present

## 2019-09-06 DIAGNOSIS — E559 Vitamin D deficiency, unspecified: Secondary | ICD-10-CM | POA: Diagnosis not present

## 2019-09-06 DIAGNOSIS — M5136 Other intervertebral disc degeneration, lumbar region: Secondary | ICD-10-CM | POA: Diagnosis not present

## 2019-09-07 DIAGNOSIS — K219 Gastro-esophageal reflux disease without esophagitis: Secondary | ICD-10-CM | POA: Diagnosis not present

## 2019-09-07 DIAGNOSIS — E7800 Pure hypercholesterolemia, unspecified: Secondary | ICD-10-CM | POA: Diagnosis not present

## 2019-09-07 DIAGNOSIS — F419 Anxiety disorder, unspecified: Secondary | ICD-10-CM | POA: Diagnosis not present

## 2019-09-07 DIAGNOSIS — F329 Major depressive disorder, single episode, unspecified: Secondary | ICD-10-CM | POA: Diagnosis not present

## 2019-09-07 DIAGNOSIS — K5900 Constipation, unspecified: Secondary | ICD-10-CM | POA: Diagnosis not present

## 2019-09-07 DIAGNOSIS — E119 Type 2 diabetes mellitus without complications: Secondary | ICD-10-CM | POA: Diagnosis not present

## 2019-09-07 DIAGNOSIS — F3341 Major depressive disorder, recurrent, in partial remission: Secondary | ICD-10-CM | POA: Diagnosis not present

## 2019-09-07 DIAGNOSIS — M65351 Trigger finger, right little finger: Secondary | ICD-10-CM | POA: Diagnosis not present

## 2019-09-07 DIAGNOSIS — H53451 Other localized visual field defect, right eye: Secondary | ICD-10-CM | POA: Diagnosis not present

## 2019-09-07 DIAGNOSIS — L819 Disorder of pigmentation, unspecified: Secondary | ICD-10-CM | POA: Diagnosis not present

## 2019-12-09 DIAGNOSIS — M659 Synovitis and tenosynovitis, unspecified: Secondary | ICD-10-CM | POA: Diagnosis not present

## 2019-12-09 DIAGNOSIS — M65352 Trigger finger, left little finger: Secondary | ICD-10-CM | POA: Diagnosis not present

## 2019-12-13 DIAGNOSIS — F32A Depression, unspecified: Secondary | ICD-10-CM | POA: Diagnosis not present

## 2019-12-13 DIAGNOSIS — K219 Gastro-esophageal reflux disease without esophagitis: Secondary | ICD-10-CM | POA: Diagnosis not present

## 2019-12-13 DIAGNOSIS — E7800 Pure hypercholesterolemia, unspecified: Secondary | ICD-10-CM | POA: Diagnosis not present

## 2019-12-13 DIAGNOSIS — Z23 Encounter for immunization: Secondary | ICD-10-CM | POA: Diagnosis not present

## 2019-12-13 DIAGNOSIS — E119 Type 2 diabetes mellitus without complications: Secondary | ICD-10-CM | POA: Diagnosis not present

## 2019-12-13 DIAGNOSIS — I10 Essential (primary) hypertension: Secondary | ICD-10-CM | POA: Diagnosis not present

## 2019-12-13 DIAGNOSIS — F419 Anxiety disorder, unspecified: Secondary | ICD-10-CM | POA: Diagnosis not present

## 2019-12-21 DIAGNOSIS — Z135 Encounter for screening for eye and ear disorders: Secondary | ICD-10-CM | POA: Diagnosis not present

## 2019-12-21 DIAGNOSIS — H259 Unspecified age-related cataract: Secondary | ICD-10-CM | POA: Diagnosis not present

## 2019-12-29 DIAGNOSIS — M7918 Myalgia, other site: Secondary | ICD-10-CM | POA: Diagnosis not present

## 2019-12-29 DIAGNOSIS — I10 Essential (primary) hypertension: Secondary | ICD-10-CM | POA: Diagnosis not present

## 2019-12-29 DIAGNOSIS — M5416 Radiculopathy, lumbar region: Secondary | ICD-10-CM | POA: Diagnosis not present

## 2019-12-29 DIAGNOSIS — M4316 Spondylolisthesis, lumbar region: Secondary | ICD-10-CM | POA: Diagnosis not present

## 2019-12-29 DIAGNOSIS — M5136 Other intervertebral disc degeneration, lumbar region: Secondary | ICD-10-CM | POA: Diagnosis not present

## 2019-12-29 DIAGNOSIS — M4726 Other spondylosis with radiculopathy, lumbar region: Secondary | ICD-10-CM | POA: Diagnosis not present

## 2020-01-04 DIAGNOSIS — E119 Type 2 diabetes mellitus without complications: Secondary | ICD-10-CM | POA: Diagnosis not present

## 2020-01-04 DIAGNOSIS — H25813 Combined forms of age-related cataract, bilateral: Secondary | ICD-10-CM | POA: Diagnosis not present

## 2020-01-17 DIAGNOSIS — M545 Low back pain, unspecified: Secondary | ICD-10-CM | POA: Diagnosis not present

## 2020-01-17 DIAGNOSIS — M5136 Other intervertebral disc degeneration, lumbar region: Secondary | ICD-10-CM | POA: Diagnosis not present

## 2020-01-20 DIAGNOSIS — M545 Low back pain, unspecified: Secondary | ICD-10-CM | POA: Diagnosis not present

## 2020-01-20 DIAGNOSIS — M47817 Spondylosis without myelopathy or radiculopathy, lumbosacral region: Secondary | ICD-10-CM | POA: Diagnosis not present

## 2020-01-25 DIAGNOSIS — Z01818 Encounter for other preprocedural examination: Secondary | ICD-10-CM | POA: Diagnosis not present

## 2020-01-25 DIAGNOSIS — E559 Vitamin D deficiency, unspecified: Secondary | ICD-10-CM | POA: Diagnosis not present

## 2020-01-25 DIAGNOSIS — Z6834 Body mass index (BMI) 34.0-34.9, adult: Secondary | ICD-10-CM | POA: Diagnosis not present

## 2020-02-01 DIAGNOSIS — N289 Disorder of kidney and ureter, unspecified: Secondary | ICD-10-CM | POA: Diagnosis not present

## 2020-02-17 DIAGNOSIS — R0981 Nasal congestion: Secondary | ICD-10-CM | POA: Diagnosis not present

## 2020-02-17 DIAGNOSIS — Z20828 Contact with and (suspected) exposure to other viral communicable diseases: Secondary | ICD-10-CM | POA: Diagnosis not present

## 2020-02-17 DIAGNOSIS — R5383 Other fatigue: Secondary | ICD-10-CM | POA: Diagnosis not present

## 2020-02-17 DIAGNOSIS — R051 Acute cough: Secondary | ICD-10-CM | POA: Diagnosis not present

## 2020-02-17 DIAGNOSIS — M791 Myalgia, unspecified site: Secondary | ICD-10-CM | POA: Diagnosis not present

## 2020-03-10 DIAGNOSIS — M5136 Other intervertebral disc degeneration, lumbar region: Secondary | ICD-10-CM | POA: Diagnosis not present

## 2020-03-10 DIAGNOSIS — Z4689 Encounter for fitting and adjustment of other specified devices: Secondary | ICD-10-CM | POA: Diagnosis not present

## 2020-03-10 DIAGNOSIS — M4316 Spondylolisthesis, lumbar region: Secondary | ICD-10-CM | POA: Diagnosis not present

## 2020-03-10 DIAGNOSIS — M5416 Radiculopathy, lumbar region: Secondary | ICD-10-CM | POA: Diagnosis not present

## 2020-03-10 DIAGNOSIS — M961 Postlaminectomy syndrome, not elsewhere classified: Secondary | ICD-10-CM | POA: Diagnosis not present

## 2020-03-10 DIAGNOSIS — M4726 Other spondylosis with radiculopathy, lumbar region: Secondary | ICD-10-CM | POA: Diagnosis not present

## 2020-03-14 DIAGNOSIS — E559 Vitamin D deficiency, unspecified: Secondary | ICD-10-CM | POA: Diagnosis not present

## 2020-03-14 DIAGNOSIS — I10 Essential (primary) hypertension: Secondary | ICD-10-CM | POA: Diagnosis not present

## 2020-03-14 DIAGNOSIS — R69 Illness, unspecified: Secondary | ICD-10-CM | POA: Diagnosis not present

## 2020-03-14 DIAGNOSIS — E7800 Pure hypercholesterolemia, unspecified: Secondary | ICD-10-CM | POA: Diagnosis not present

## 2020-03-14 DIAGNOSIS — Z6833 Body mass index (BMI) 33.0-33.9, adult: Secondary | ICD-10-CM | POA: Diagnosis not present

## 2020-03-14 DIAGNOSIS — E119 Type 2 diabetes mellitus without complications: Secondary | ICD-10-CM | POA: Diagnosis not present

## 2020-03-21 DIAGNOSIS — E785 Hyperlipidemia, unspecified: Secondary | ICD-10-CM | POA: Diagnosis not present

## 2020-03-21 DIAGNOSIS — M4317 Spondylolisthesis, lumbosacral region: Secondary | ICD-10-CM | POA: Diagnosis not present

## 2020-03-21 DIAGNOSIS — Z20822 Contact with and (suspected) exposure to covid-19: Secondary | ICD-10-CM | POA: Diagnosis not present

## 2020-03-21 DIAGNOSIS — Z01812 Encounter for preprocedural laboratory examination: Secondary | ICD-10-CM | POA: Diagnosis not present

## 2020-03-21 DIAGNOSIS — M4316 Spondylolisthesis, lumbar region: Secondary | ICD-10-CM | POA: Diagnosis not present

## 2020-03-21 DIAGNOSIS — M961 Postlaminectomy syndrome, not elsewhere classified: Secondary | ICD-10-CM | POA: Diagnosis not present

## 2020-03-21 DIAGNOSIS — M48061 Spinal stenosis, lumbar region without neurogenic claudication: Secondary | ICD-10-CM | POA: Diagnosis not present

## 2020-03-21 DIAGNOSIS — I10 Essential (primary) hypertension: Secondary | ICD-10-CM | POA: Diagnosis not present

## 2020-03-21 DIAGNOSIS — E119 Type 2 diabetes mellitus without complications: Secondary | ICD-10-CM | POA: Diagnosis not present

## 2020-03-21 DIAGNOSIS — M47817 Spondylosis without myelopathy or radiculopathy, lumbosacral region: Secondary | ICD-10-CM | POA: Diagnosis not present

## 2020-03-21 DIAGNOSIS — Z853 Personal history of malignant neoplasm of breast: Secondary | ICD-10-CM | POA: Diagnosis not present

## 2020-03-28 DIAGNOSIS — M4326 Fusion of spine, lumbar region: Secondary | ICD-10-CM | POA: Diagnosis not present

## 2020-03-28 DIAGNOSIS — M48062 Spinal stenosis, lumbar region with neurogenic claudication: Secondary | ICD-10-CM | POA: Diagnosis not present

## 2020-03-28 DIAGNOSIS — I10 Essential (primary) hypertension: Secondary | ICD-10-CM | POA: Diagnosis not present

## 2020-03-28 DIAGNOSIS — E119 Type 2 diabetes mellitus without complications: Secondary | ICD-10-CM | POA: Diagnosis not present

## 2020-03-28 DIAGNOSIS — M961 Postlaminectomy syndrome, not elsewhere classified: Secondary | ICD-10-CM | POA: Diagnosis not present

## 2020-03-28 DIAGNOSIS — M4317 Spondylolisthesis, lumbosacral region: Secondary | ICD-10-CM | POA: Diagnosis not present

## 2020-03-28 DIAGNOSIS — M48061 Spinal stenosis, lumbar region without neurogenic claudication: Secondary | ICD-10-CM | POA: Diagnosis not present

## 2020-03-28 DIAGNOSIS — Z853 Personal history of malignant neoplasm of breast: Secondary | ICD-10-CM | POA: Diagnosis not present

## 2020-03-28 DIAGNOSIS — M4316 Spondylolisthesis, lumbar region: Secondary | ICD-10-CM | POA: Diagnosis not present

## 2020-03-28 DIAGNOSIS — E785 Hyperlipidemia, unspecified: Secondary | ICD-10-CM | POA: Diagnosis not present

## 2020-03-28 DIAGNOSIS — G8918 Other acute postprocedural pain: Secondary | ICD-10-CM | POA: Diagnosis not present

## 2020-03-28 DIAGNOSIS — M47817 Spondylosis without myelopathy or radiculopathy, lumbosacral region: Secondary | ICD-10-CM | POA: Diagnosis not present

## 2020-03-28 DIAGNOSIS — Z20822 Contact with and (suspected) exposure to covid-19: Secondary | ICD-10-CM | POA: Diagnosis not present

## 2020-03-28 DIAGNOSIS — M4726 Other spondylosis with radiculopathy, lumbar region: Secondary | ICD-10-CM | POA: Diagnosis not present

## 2020-03-28 DIAGNOSIS — Z981 Arthrodesis status: Secondary | ICD-10-CM | POA: Diagnosis not present

## 2020-03-29 DIAGNOSIS — E119 Type 2 diabetes mellitus without complications: Secondary | ICD-10-CM | POA: Diagnosis not present

## 2020-03-29 DIAGNOSIS — R531 Weakness: Secondary | ICD-10-CM | POA: Diagnosis not present

## 2020-03-29 DIAGNOSIS — I10 Essential (primary) hypertension: Secondary | ICD-10-CM | POA: Diagnosis not present

## 2020-03-29 DIAGNOSIS — M4317 Spondylolisthesis, lumbosacral region: Secondary | ICD-10-CM | POA: Diagnosis not present

## 2020-03-29 DIAGNOSIS — M48061 Spinal stenosis, lumbar region without neurogenic claudication: Secondary | ICD-10-CM | POA: Diagnosis not present

## 2020-03-29 DIAGNOSIS — Z20822 Contact with and (suspected) exposure to covid-19: Secondary | ICD-10-CM | POA: Diagnosis not present

## 2020-03-29 DIAGNOSIS — M47817 Spondylosis without myelopathy or radiculopathy, lumbosacral region: Secondary | ICD-10-CM | POA: Diagnosis not present

## 2020-03-29 DIAGNOSIS — M4316 Spondylolisthesis, lumbar region: Secondary | ICD-10-CM | POA: Diagnosis not present

## 2020-03-29 DIAGNOSIS — E785 Hyperlipidemia, unspecified: Secondary | ICD-10-CM | POA: Diagnosis not present

## 2020-03-29 DIAGNOSIS — Z853 Personal history of malignant neoplasm of breast: Secondary | ICD-10-CM | POA: Diagnosis not present

## 2020-03-29 DIAGNOSIS — Z981 Arthrodesis status: Secondary | ICD-10-CM | POA: Diagnosis not present

## 2020-03-29 DIAGNOSIS — M5136 Other intervertebral disc degeneration, lumbar region: Secondary | ICD-10-CM | POA: Diagnosis not present

## 2020-03-29 DIAGNOSIS — M4326 Fusion of spine, lumbar region: Secondary | ICD-10-CM | POA: Diagnosis not present

## 2020-03-29 DIAGNOSIS — I700 Atherosclerosis of aorta: Secondary | ICD-10-CM | POA: Diagnosis not present

## 2020-03-29 DIAGNOSIS — M961 Postlaminectomy syndrome, not elsewhere classified: Secondary | ICD-10-CM | POA: Diagnosis not present

## 2020-03-30 DIAGNOSIS — M4317 Spondylolisthesis, lumbosacral region: Secondary | ICD-10-CM | POA: Diagnosis not present

## 2020-03-30 DIAGNOSIS — I10 Essential (primary) hypertension: Secondary | ICD-10-CM | POA: Diagnosis not present

## 2020-03-30 DIAGNOSIS — M961 Postlaminectomy syndrome, not elsewhere classified: Secondary | ICD-10-CM | POA: Diagnosis not present

## 2020-03-30 DIAGNOSIS — M47817 Spondylosis without myelopathy or radiculopathy, lumbosacral region: Secondary | ICD-10-CM | POA: Diagnosis not present

## 2020-03-30 DIAGNOSIS — E119 Type 2 diabetes mellitus without complications: Secondary | ICD-10-CM | POA: Diagnosis not present

## 2020-03-30 DIAGNOSIS — E785 Hyperlipidemia, unspecified: Secondary | ICD-10-CM | POA: Diagnosis not present

## 2020-03-30 DIAGNOSIS — M4316 Spondylolisthesis, lumbar region: Secondary | ICD-10-CM | POA: Diagnosis not present

## 2020-03-30 DIAGNOSIS — Z853 Personal history of malignant neoplasm of breast: Secondary | ICD-10-CM | POA: Diagnosis not present

## 2020-03-30 DIAGNOSIS — Z20822 Contact with and (suspected) exposure to covid-19: Secondary | ICD-10-CM | POA: Diagnosis not present

## 2020-03-30 DIAGNOSIS — M48061 Spinal stenosis, lumbar region without neurogenic claudication: Secondary | ICD-10-CM | POA: Diagnosis not present

## 2020-04-01 DIAGNOSIS — G8929 Other chronic pain: Secondary | ICD-10-CM | POA: Diagnosis not present

## 2020-04-01 DIAGNOSIS — Z4789 Encounter for other orthopedic aftercare: Secondary | ICD-10-CM | POA: Diagnosis not present

## 2020-04-01 DIAGNOSIS — Z7984 Long term (current) use of oral hypoglycemic drugs: Secondary | ICD-10-CM | POA: Diagnosis not present

## 2020-04-01 DIAGNOSIS — E785 Hyperlipidemia, unspecified: Secondary | ICD-10-CM | POA: Diagnosis not present

## 2020-04-01 DIAGNOSIS — M961 Postlaminectomy syndrome, not elsewhere classified: Secondary | ICD-10-CM | POA: Diagnosis not present

## 2020-04-01 DIAGNOSIS — M4316 Spondylolisthesis, lumbar region: Secondary | ICD-10-CM | POA: Diagnosis not present

## 2020-04-01 DIAGNOSIS — Z981 Arthrodesis status: Secondary | ICD-10-CM | POA: Diagnosis not present

## 2020-04-01 DIAGNOSIS — I10 Essential (primary) hypertension: Secondary | ICD-10-CM | POA: Diagnosis not present

## 2020-04-01 DIAGNOSIS — E119 Type 2 diabetes mellitus without complications: Secondary | ICD-10-CM | POA: Diagnosis not present

## 2020-04-01 DIAGNOSIS — M5416 Radiculopathy, lumbar region: Secondary | ICD-10-CM | POA: Diagnosis not present

## 2020-04-01 DIAGNOSIS — Z853 Personal history of malignant neoplasm of breast: Secondary | ICD-10-CM | POA: Diagnosis not present

## 2020-04-01 DIAGNOSIS — Z9181 History of falling: Secondary | ICD-10-CM | POA: Diagnosis not present

## 2020-04-01 DIAGNOSIS — M5136 Other intervertebral disc degeneration, lumbar region: Secondary | ICD-10-CM | POA: Diagnosis not present

## 2020-04-01 DIAGNOSIS — M47817 Spondylosis without myelopathy or radiculopathy, lumbosacral region: Secondary | ICD-10-CM | POA: Diagnosis not present

## 2020-04-01 DIAGNOSIS — M461 Sacroiliitis, not elsewhere classified: Secondary | ICD-10-CM | POA: Diagnosis not present

## 2020-04-06 DIAGNOSIS — Z4789 Encounter for other orthopedic aftercare: Secondary | ICD-10-CM | POA: Diagnosis not present

## 2020-04-06 DIAGNOSIS — Z981 Arthrodesis status: Secondary | ICD-10-CM | POA: Diagnosis not present

## 2020-04-06 DIAGNOSIS — M5136 Other intervertebral disc degeneration, lumbar region: Secondary | ICD-10-CM | POA: Diagnosis not present

## 2020-04-06 DIAGNOSIS — Z7984 Long term (current) use of oral hypoglycemic drugs: Secondary | ICD-10-CM | POA: Diagnosis not present

## 2020-04-06 DIAGNOSIS — Z853 Personal history of malignant neoplasm of breast: Secondary | ICD-10-CM | POA: Diagnosis not present

## 2020-04-06 DIAGNOSIS — I10 Essential (primary) hypertension: Secondary | ICD-10-CM | POA: Diagnosis not present

## 2020-04-06 DIAGNOSIS — Z9181 History of falling: Secondary | ICD-10-CM | POA: Diagnosis not present

## 2020-04-06 DIAGNOSIS — M4316 Spondylolisthesis, lumbar region: Secondary | ICD-10-CM | POA: Diagnosis not present

## 2020-04-06 DIAGNOSIS — E119 Type 2 diabetes mellitus without complications: Secondary | ICD-10-CM | POA: Diagnosis not present

## 2020-04-06 DIAGNOSIS — M461 Sacroiliitis, not elsewhere classified: Secondary | ICD-10-CM | POA: Diagnosis not present

## 2020-04-06 DIAGNOSIS — E785 Hyperlipidemia, unspecified: Secondary | ICD-10-CM | POA: Diagnosis not present

## 2020-04-06 DIAGNOSIS — G8929 Other chronic pain: Secondary | ICD-10-CM | POA: Diagnosis not present

## 2020-04-06 DIAGNOSIS — M47817 Spondylosis without myelopathy or radiculopathy, lumbosacral region: Secondary | ICD-10-CM | POA: Diagnosis not present

## 2020-04-06 DIAGNOSIS — M5416 Radiculopathy, lumbar region: Secondary | ICD-10-CM | POA: Diagnosis not present

## 2020-04-06 DIAGNOSIS — M961 Postlaminectomy syndrome, not elsewhere classified: Secondary | ICD-10-CM | POA: Diagnosis not present

## 2020-04-07 DIAGNOSIS — Z9181 History of falling: Secondary | ICD-10-CM | POA: Diagnosis not present

## 2020-04-07 DIAGNOSIS — M5416 Radiculopathy, lumbar region: Secondary | ICD-10-CM | POA: Diagnosis not present

## 2020-04-07 DIAGNOSIS — E119 Type 2 diabetes mellitus without complications: Secondary | ICD-10-CM | POA: Diagnosis not present

## 2020-04-07 DIAGNOSIS — Z853 Personal history of malignant neoplasm of breast: Secondary | ICD-10-CM | POA: Diagnosis not present

## 2020-04-07 DIAGNOSIS — E785 Hyperlipidemia, unspecified: Secondary | ICD-10-CM | POA: Diagnosis not present

## 2020-04-07 DIAGNOSIS — M4316 Spondylolisthesis, lumbar region: Secondary | ICD-10-CM | POA: Diagnosis not present

## 2020-04-07 DIAGNOSIS — M5136 Other intervertebral disc degeneration, lumbar region: Secondary | ICD-10-CM | POA: Diagnosis not present

## 2020-04-07 DIAGNOSIS — Z4789 Encounter for other orthopedic aftercare: Secondary | ICD-10-CM | POA: Diagnosis not present

## 2020-04-07 DIAGNOSIS — Z7984 Long term (current) use of oral hypoglycemic drugs: Secondary | ICD-10-CM | POA: Diagnosis not present

## 2020-04-07 DIAGNOSIS — M47817 Spondylosis without myelopathy or radiculopathy, lumbosacral region: Secondary | ICD-10-CM | POA: Diagnosis not present

## 2020-04-07 DIAGNOSIS — I10 Essential (primary) hypertension: Secondary | ICD-10-CM | POA: Diagnosis not present

## 2020-04-07 DIAGNOSIS — G8929 Other chronic pain: Secondary | ICD-10-CM | POA: Diagnosis not present

## 2020-04-07 DIAGNOSIS — Z981 Arthrodesis status: Secondary | ICD-10-CM | POA: Diagnosis not present

## 2020-04-07 DIAGNOSIS — M961 Postlaminectomy syndrome, not elsewhere classified: Secondary | ICD-10-CM | POA: Diagnosis not present

## 2020-04-07 DIAGNOSIS — M461 Sacroiliitis, not elsewhere classified: Secondary | ICD-10-CM | POA: Diagnosis not present

## 2020-04-11 DIAGNOSIS — M47817 Spondylosis without myelopathy or radiculopathy, lumbosacral region: Secondary | ICD-10-CM | POA: Diagnosis not present

## 2020-04-11 DIAGNOSIS — Z7984 Long term (current) use of oral hypoglycemic drugs: Secondary | ICD-10-CM | POA: Diagnosis not present

## 2020-04-11 DIAGNOSIS — I10 Essential (primary) hypertension: Secondary | ICD-10-CM | POA: Diagnosis not present

## 2020-04-11 DIAGNOSIS — M5136 Other intervertebral disc degeneration, lumbar region: Secondary | ICD-10-CM | POA: Diagnosis not present

## 2020-04-11 DIAGNOSIS — M461 Sacroiliitis, not elsewhere classified: Secondary | ICD-10-CM | POA: Diagnosis not present

## 2020-04-11 DIAGNOSIS — Z981 Arthrodesis status: Secondary | ICD-10-CM | POA: Diagnosis not present

## 2020-04-11 DIAGNOSIS — Z4789 Encounter for other orthopedic aftercare: Secondary | ICD-10-CM | POA: Diagnosis not present

## 2020-04-11 DIAGNOSIS — M5416 Radiculopathy, lumbar region: Secondary | ICD-10-CM | POA: Diagnosis not present

## 2020-04-11 DIAGNOSIS — E119 Type 2 diabetes mellitus without complications: Secondary | ICD-10-CM | POA: Diagnosis not present

## 2020-04-11 DIAGNOSIS — Z853 Personal history of malignant neoplasm of breast: Secondary | ICD-10-CM | POA: Diagnosis not present

## 2020-04-11 DIAGNOSIS — Z9181 History of falling: Secondary | ICD-10-CM | POA: Diagnosis not present

## 2020-04-11 DIAGNOSIS — M961 Postlaminectomy syndrome, not elsewhere classified: Secondary | ICD-10-CM | POA: Diagnosis not present

## 2020-04-11 DIAGNOSIS — E785 Hyperlipidemia, unspecified: Secondary | ICD-10-CM | POA: Diagnosis not present

## 2020-04-11 DIAGNOSIS — M4316 Spondylolisthesis, lumbar region: Secondary | ICD-10-CM | POA: Diagnosis not present

## 2020-04-11 DIAGNOSIS — G8929 Other chronic pain: Secondary | ICD-10-CM | POA: Diagnosis not present

## 2020-04-13 DIAGNOSIS — Z853 Personal history of malignant neoplasm of breast: Secondary | ICD-10-CM | POA: Diagnosis not present

## 2020-04-13 DIAGNOSIS — Z9181 History of falling: Secondary | ICD-10-CM | POA: Diagnosis not present

## 2020-04-13 DIAGNOSIS — Z981 Arthrodesis status: Secondary | ICD-10-CM | POA: Diagnosis not present

## 2020-04-13 DIAGNOSIS — Z4789 Encounter for other orthopedic aftercare: Secondary | ICD-10-CM | POA: Diagnosis not present

## 2020-04-13 DIAGNOSIS — Z7984 Long term (current) use of oral hypoglycemic drugs: Secondary | ICD-10-CM | POA: Diagnosis not present

## 2020-04-13 DIAGNOSIS — E119 Type 2 diabetes mellitus without complications: Secondary | ICD-10-CM | POA: Diagnosis not present

## 2020-04-13 DIAGNOSIS — M5416 Radiculopathy, lumbar region: Secondary | ICD-10-CM | POA: Diagnosis not present

## 2020-04-13 DIAGNOSIS — M5136 Other intervertebral disc degeneration, lumbar region: Secondary | ICD-10-CM | POA: Diagnosis not present

## 2020-04-13 DIAGNOSIS — M461 Sacroiliitis, not elsewhere classified: Secondary | ICD-10-CM | POA: Diagnosis not present

## 2020-04-13 DIAGNOSIS — M4316 Spondylolisthesis, lumbar region: Secondary | ICD-10-CM | POA: Diagnosis not present

## 2020-04-13 DIAGNOSIS — M961 Postlaminectomy syndrome, not elsewhere classified: Secondary | ICD-10-CM | POA: Diagnosis not present

## 2020-04-13 DIAGNOSIS — E785 Hyperlipidemia, unspecified: Secondary | ICD-10-CM | POA: Diagnosis not present

## 2020-04-13 DIAGNOSIS — M47817 Spondylosis without myelopathy or radiculopathy, lumbosacral region: Secondary | ICD-10-CM | POA: Diagnosis not present

## 2020-04-13 DIAGNOSIS — G8929 Other chronic pain: Secondary | ICD-10-CM | POA: Diagnosis not present

## 2020-04-13 DIAGNOSIS — I10 Essential (primary) hypertension: Secondary | ICD-10-CM | POA: Diagnosis not present

## 2020-04-17 DIAGNOSIS — I10 Essential (primary) hypertension: Secondary | ICD-10-CM | POA: Diagnosis not present

## 2020-04-17 DIAGNOSIS — Z6834 Body mass index (BMI) 34.0-34.9, adult: Secondary | ICD-10-CM | POA: Diagnosis not present

## 2020-04-17 DIAGNOSIS — J302 Other seasonal allergic rhinitis: Secondary | ICD-10-CM | POA: Diagnosis not present

## 2020-04-18 DIAGNOSIS — Z9181 History of falling: Secondary | ICD-10-CM | POA: Diagnosis not present

## 2020-04-18 DIAGNOSIS — G8929 Other chronic pain: Secondary | ICD-10-CM | POA: Diagnosis not present

## 2020-04-18 DIAGNOSIS — Z7984 Long term (current) use of oral hypoglycemic drugs: Secondary | ICD-10-CM | POA: Diagnosis not present

## 2020-04-18 DIAGNOSIS — M461 Sacroiliitis, not elsewhere classified: Secondary | ICD-10-CM | POA: Diagnosis not present

## 2020-04-18 DIAGNOSIS — M5136 Other intervertebral disc degeneration, lumbar region: Secondary | ICD-10-CM | POA: Diagnosis not present

## 2020-04-18 DIAGNOSIS — M4316 Spondylolisthesis, lumbar region: Secondary | ICD-10-CM | POA: Diagnosis not present

## 2020-04-18 DIAGNOSIS — Z981 Arthrodesis status: Secondary | ICD-10-CM | POA: Diagnosis not present

## 2020-04-18 DIAGNOSIS — Z4789 Encounter for other orthopedic aftercare: Secondary | ICD-10-CM | POA: Diagnosis not present

## 2020-04-18 DIAGNOSIS — M5416 Radiculopathy, lumbar region: Secondary | ICD-10-CM | POA: Diagnosis not present

## 2020-04-18 DIAGNOSIS — E119 Type 2 diabetes mellitus without complications: Secondary | ICD-10-CM | POA: Diagnosis not present

## 2020-04-18 DIAGNOSIS — E785 Hyperlipidemia, unspecified: Secondary | ICD-10-CM | POA: Diagnosis not present

## 2020-04-18 DIAGNOSIS — M47817 Spondylosis without myelopathy or radiculopathy, lumbosacral region: Secondary | ICD-10-CM | POA: Diagnosis not present

## 2020-04-18 DIAGNOSIS — M961 Postlaminectomy syndrome, not elsewhere classified: Secondary | ICD-10-CM | POA: Diagnosis not present

## 2020-04-18 DIAGNOSIS — I10 Essential (primary) hypertension: Secondary | ICD-10-CM | POA: Diagnosis not present

## 2020-04-18 DIAGNOSIS — Z853 Personal history of malignant neoplasm of breast: Secondary | ICD-10-CM | POA: Diagnosis not present

## 2020-04-19 DIAGNOSIS — M5416 Radiculopathy, lumbar region: Secondary | ICD-10-CM | POA: Diagnosis not present

## 2020-04-19 DIAGNOSIS — Z4789 Encounter for other orthopedic aftercare: Secondary | ICD-10-CM | POA: Diagnosis not present

## 2020-04-19 DIAGNOSIS — M4316 Spondylolisthesis, lumbar region: Secondary | ICD-10-CM | POA: Diagnosis not present

## 2020-04-19 DIAGNOSIS — M47817 Spondylosis without myelopathy or radiculopathy, lumbosacral region: Secondary | ICD-10-CM | POA: Diagnosis not present

## 2020-04-21 DIAGNOSIS — Z981 Arthrodesis status: Secondary | ICD-10-CM | POA: Diagnosis not present

## 2020-04-21 DIAGNOSIS — Z853 Personal history of malignant neoplasm of breast: Secondary | ICD-10-CM | POA: Diagnosis not present

## 2020-04-21 DIAGNOSIS — E785 Hyperlipidemia, unspecified: Secondary | ICD-10-CM | POA: Diagnosis not present

## 2020-04-21 DIAGNOSIS — I10 Essential (primary) hypertension: Secondary | ICD-10-CM | POA: Diagnosis not present

## 2020-04-21 DIAGNOSIS — M5416 Radiculopathy, lumbar region: Secondary | ICD-10-CM | POA: Diagnosis not present

## 2020-04-21 DIAGNOSIS — Z7984 Long term (current) use of oral hypoglycemic drugs: Secondary | ICD-10-CM | POA: Diagnosis not present

## 2020-04-21 DIAGNOSIS — M4316 Spondylolisthesis, lumbar region: Secondary | ICD-10-CM | POA: Diagnosis not present

## 2020-04-21 DIAGNOSIS — G8929 Other chronic pain: Secondary | ICD-10-CM | POA: Diagnosis not present

## 2020-04-21 DIAGNOSIS — Z9181 History of falling: Secondary | ICD-10-CM | POA: Diagnosis not present

## 2020-04-21 DIAGNOSIS — E119 Type 2 diabetes mellitus without complications: Secondary | ICD-10-CM | POA: Diagnosis not present

## 2020-04-21 DIAGNOSIS — M47817 Spondylosis without myelopathy or radiculopathy, lumbosacral region: Secondary | ICD-10-CM | POA: Diagnosis not present

## 2020-04-21 DIAGNOSIS — M961 Postlaminectomy syndrome, not elsewhere classified: Secondary | ICD-10-CM | POA: Diagnosis not present

## 2020-04-21 DIAGNOSIS — M5136 Other intervertebral disc degeneration, lumbar region: Secondary | ICD-10-CM | POA: Diagnosis not present

## 2020-04-21 DIAGNOSIS — M461 Sacroiliitis, not elsewhere classified: Secondary | ICD-10-CM | POA: Diagnosis not present

## 2020-04-21 DIAGNOSIS — Z4789 Encounter for other orthopedic aftercare: Secondary | ICD-10-CM | POA: Diagnosis not present

## 2020-04-27 DIAGNOSIS — R69 Illness, unspecified: Secondary | ICD-10-CM | POA: Diagnosis not present

## 2020-04-27 DIAGNOSIS — M5416 Radiculopathy, lumbar region: Secondary | ICD-10-CM | POA: Diagnosis not present

## 2020-04-27 DIAGNOSIS — M5136 Other intervertebral disc degeneration, lumbar region: Secondary | ICD-10-CM | POA: Diagnosis not present

## 2020-04-27 DIAGNOSIS — I10 Essential (primary) hypertension: Secondary | ICD-10-CM | POA: Diagnosis not present

## 2020-04-27 DIAGNOSIS — Z9181 History of falling: Secondary | ICD-10-CM | POA: Diagnosis not present

## 2020-04-27 DIAGNOSIS — E119 Type 2 diabetes mellitus without complications: Secondary | ICD-10-CM | POA: Diagnosis not present

## 2020-04-27 DIAGNOSIS — G8929 Other chronic pain: Secondary | ICD-10-CM | POA: Diagnosis not present

## 2020-04-27 DIAGNOSIS — Z853 Personal history of malignant neoplasm of breast: Secondary | ICD-10-CM | POA: Diagnosis not present

## 2020-04-27 DIAGNOSIS — Z4789 Encounter for other orthopedic aftercare: Secondary | ICD-10-CM | POA: Diagnosis not present

## 2020-04-27 DIAGNOSIS — M47817 Spondylosis without myelopathy or radiculopathy, lumbosacral region: Secondary | ICD-10-CM | POA: Diagnosis not present

## 2020-04-27 DIAGNOSIS — M461 Sacroiliitis, not elsewhere classified: Secondary | ICD-10-CM | POA: Diagnosis not present

## 2020-04-27 DIAGNOSIS — Z7984 Long term (current) use of oral hypoglycemic drugs: Secondary | ICD-10-CM | POA: Diagnosis not present

## 2020-04-27 DIAGNOSIS — Z981 Arthrodesis status: Secondary | ICD-10-CM | POA: Diagnosis not present

## 2020-04-27 DIAGNOSIS — E785 Hyperlipidemia, unspecified: Secondary | ICD-10-CM | POA: Diagnosis not present

## 2020-04-27 DIAGNOSIS — M4316 Spondylolisthesis, lumbar region: Secondary | ICD-10-CM | POA: Diagnosis not present

## 2020-04-27 DIAGNOSIS — M961 Postlaminectomy syndrome, not elsewhere classified: Secondary | ICD-10-CM | POA: Diagnosis not present

## 2020-04-27 DIAGNOSIS — F32A Depression, unspecified: Secondary | ICD-10-CM | POA: Diagnosis not present

## 2020-04-28 DIAGNOSIS — M47817 Spondylosis without myelopathy or radiculopathy, lumbosacral region: Secondary | ICD-10-CM | POA: Diagnosis not present

## 2020-04-28 DIAGNOSIS — M961 Postlaminectomy syndrome, not elsewhere classified: Secondary | ICD-10-CM | POA: Diagnosis not present

## 2020-04-28 DIAGNOSIS — M4316 Spondylolisthesis, lumbar region: Secondary | ICD-10-CM | POA: Diagnosis not present

## 2020-04-28 DIAGNOSIS — M5136 Other intervertebral disc degeneration, lumbar region: Secondary | ICD-10-CM | POA: Diagnosis not present

## 2020-04-28 DIAGNOSIS — Z853 Personal history of malignant neoplasm of breast: Secondary | ICD-10-CM | POA: Diagnosis not present

## 2020-04-28 DIAGNOSIS — E785 Hyperlipidemia, unspecified: Secondary | ICD-10-CM | POA: Diagnosis not present

## 2020-04-28 DIAGNOSIS — M461 Sacroiliitis, not elsewhere classified: Secondary | ICD-10-CM | POA: Diagnosis not present

## 2020-04-28 DIAGNOSIS — I10 Essential (primary) hypertension: Secondary | ICD-10-CM | POA: Diagnosis not present

## 2020-04-28 DIAGNOSIS — Z9181 History of falling: Secondary | ICD-10-CM | POA: Diagnosis not present

## 2020-04-28 DIAGNOSIS — M5416 Radiculopathy, lumbar region: Secondary | ICD-10-CM | POA: Diagnosis not present

## 2020-04-28 DIAGNOSIS — E119 Type 2 diabetes mellitus without complications: Secondary | ICD-10-CM | POA: Diagnosis not present

## 2020-04-28 DIAGNOSIS — Z981 Arthrodesis status: Secondary | ICD-10-CM | POA: Diagnosis not present

## 2020-04-28 DIAGNOSIS — Z7984 Long term (current) use of oral hypoglycemic drugs: Secondary | ICD-10-CM | POA: Diagnosis not present

## 2020-04-28 DIAGNOSIS — G8929 Other chronic pain: Secondary | ICD-10-CM | POA: Diagnosis not present

## 2020-04-28 DIAGNOSIS — Z4789 Encounter for other orthopedic aftercare: Secondary | ICD-10-CM | POA: Diagnosis not present

## 2020-05-31 ENCOUNTER — Inpatient Hospital Stay: Payer: Medicare HMO | Admitting: Oncology

## 2020-06-14 DIAGNOSIS — S20212A Contusion of left front wall of thorax, initial encounter: Secondary | ICD-10-CM | POA: Diagnosis not present

## 2020-06-14 DIAGNOSIS — W19XXXA Unspecified fall, initial encounter: Secondary | ICD-10-CM | POA: Diagnosis not present

## 2020-06-14 DIAGNOSIS — B372 Candidiasis of skin and nail: Secondary | ICD-10-CM | POA: Diagnosis not present

## 2020-06-20 DIAGNOSIS — E119 Type 2 diabetes mellitus without complications: Secondary | ICD-10-CM | POA: Diagnosis not present

## 2020-06-20 DIAGNOSIS — W19XXXA Unspecified fall, initial encounter: Secondary | ICD-10-CM | POA: Diagnosis not present

## 2020-06-20 DIAGNOSIS — E7800 Pure hypercholesterolemia, unspecified: Secondary | ICD-10-CM | POA: Diagnosis not present

## 2020-06-20 DIAGNOSIS — E559 Vitamin D deficiency, unspecified: Secondary | ICD-10-CM | POA: Diagnosis not present

## 2020-06-20 DIAGNOSIS — I10 Essential (primary) hypertension: Secondary | ICD-10-CM | POA: Diagnosis not present

## 2020-06-20 DIAGNOSIS — Z6833 Body mass index (BMI) 33.0-33.9, adult: Secondary | ICD-10-CM | POA: Diagnosis not present

## 2020-06-20 DIAGNOSIS — R69 Illness, unspecified: Secondary | ICD-10-CM | POA: Diagnosis not present

## 2020-06-20 DIAGNOSIS — K219 Gastro-esophageal reflux disease without esophagitis: Secondary | ICD-10-CM | POA: Diagnosis not present

## 2020-06-20 DIAGNOSIS — B353 Tinea pedis: Secondary | ICD-10-CM | POA: Diagnosis not present

## 2020-06-20 DIAGNOSIS — R0781 Pleurodynia: Secondary | ICD-10-CM | POA: Diagnosis not present

## 2020-07-03 DIAGNOSIS — Z1231 Encounter for screening mammogram for malignant neoplasm of breast: Secondary | ICD-10-CM | POA: Diagnosis not present

## 2020-07-11 ENCOUNTER — Encounter: Payer: Self-pay | Admitting: Oncology

## 2020-07-11 DIAGNOSIS — Z1331 Encounter for screening for depression: Secondary | ICD-10-CM | POA: Diagnosis not present

## 2020-07-11 DIAGNOSIS — Z0000 Encounter for general adult medical examination without abnormal findings: Secondary | ICD-10-CM | POA: Diagnosis not present

## 2020-07-11 DIAGNOSIS — Z9181 History of falling: Secondary | ICD-10-CM | POA: Diagnosis not present

## 2020-07-11 DIAGNOSIS — Z139 Encounter for screening, unspecified: Secondary | ICD-10-CM | POA: Diagnosis not present

## 2020-07-11 DIAGNOSIS — E785 Hyperlipidemia, unspecified: Secondary | ICD-10-CM | POA: Diagnosis not present

## 2020-07-11 DIAGNOSIS — E669 Obesity, unspecified: Secondary | ICD-10-CM | POA: Diagnosis not present

## 2020-07-12 DIAGNOSIS — Z79899 Other long term (current) drug therapy: Secondary | ICD-10-CM | POA: Diagnosis not present

## 2020-07-12 DIAGNOSIS — M4316 Spondylolisthesis, lumbar region: Secondary | ICD-10-CM | POA: Diagnosis not present

## 2020-07-12 DIAGNOSIS — M5136 Other intervertebral disc degeneration, lumbar region: Secondary | ICD-10-CM | POA: Diagnosis not present

## 2020-07-12 DIAGNOSIS — M4326 Fusion of spine, lumbar region: Secondary | ICD-10-CM | POA: Diagnosis not present

## 2020-08-14 DIAGNOSIS — H524 Presbyopia: Secondary | ICD-10-CM | POA: Diagnosis not present

## 2020-08-14 DIAGNOSIS — E119 Type 2 diabetes mellitus without complications: Secondary | ICD-10-CM | POA: Diagnosis not present

## 2020-09-01 ENCOUNTER — Inpatient Hospital Stay: Payer: Medicare HMO | Admitting: Oncology

## 2020-09-19 DIAGNOSIS — R69 Illness, unspecified: Secondary | ICD-10-CM | POA: Diagnosis not present

## 2020-09-19 DIAGNOSIS — Z6832 Body mass index (BMI) 32.0-32.9, adult: Secondary | ICD-10-CM | POA: Diagnosis not present

## 2020-09-19 DIAGNOSIS — Z79899 Other long term (current) drug therapy: Secondary | ICD-10-CM | POA: Diagnosis not present

## 2020-09-19 DIAGNOSIS — E119 Type 2 diabetes mellitus without complications: Secondary | ICD-10-CM | POA: Diagnosis not present

## 2020-09-19 DIAGNOSIS — K219 Gastro-esophageal reflux disease without esophagitis: Secondary | ICD-10-CM | POA: Diagnosis not present

## 2020-09-19 DIAGNOSIS — E785 Hyperlipidemia, unspecified: Secondary | ICD-10-CM | POA: Diagnosis not present

## 2020-09-19 DIAGNOSIS — I10 Essential (primary) hypertension: Secondary | ICD-10-CM | POA: Diagnosis not present

## 2020-09-19 DIAGNOSIS — E559 Vitamin D deficiency, unspecified: Secondary | ICD-10-CM | POA: Diagnosis not present

## 2020-09-20 ENCOUNTER — Inpatient Hospital Stay: Payer: Medicare HMO | Admitting: Oncology

## 2020-10-12 DIAGNOSIS — M4326 Fusion of spine, lumbar region: Secondary | ICD-10-CM | POA: Diagnosis not present

## 2020-10-12 DIAGNOSIS — M1612 Unilateral primary osteoarthritis, left hip: Secondary | ICD-10-CM | POA: Diagnosis not present

## 2020-10-12 DIAGNOSIS — I10 Essential (primary) hypertension: Secondary | ICD-10-CM | POA: Diagnosis not present

## 2020-10-12 DIAGNOSIS — M5136 Other intervertebral disc degeneration, lumbar region: Secondary | ICD-10-CM | POA: Diagnosis not present

## 2020-10-12 DIAGNOSIS — Z6832 Body mass index (BMI) 32.0-32.9, adult: Secondary | ICD-10-CM | POA: Diagnosis not present

## 2020-11-16 DIAGNOSIS — Z79899 Other long term (current) drug therapy: Secondary | ICD-10-CM | POA: Diagnosis not present

## 2021-01-16 DIAGNOSIS — J0100 Acute maxillary sinusitis, unspecified: Secondary | ICD-10-CM | POA: Diagnosis not present

## 2021-01-16 DIAGNOSIS — Z6833 Body mass index (BMI) 33.0-33.9, adult: Secondary | ICD-10-CM | POA: Diagnosis not present

## 2021-01-23 DIAGNOSIS — E785 Hyperlipidemia, unspecified: Secondary | ICD-10-CM | POA: Diagnosis not present

## 2021-01-23 DIAGNOSIS — E1169 Type 2 diabetes mellitus with other specified complication: Secondary | ICD-10-CM | POA: Diagnosis not present

## 2021-01-23 DIAGNOSIS — Z6833 Body mass index (BMI) 33.0-33.9, adult: Secondary | ICD-10-CM | POA: Diagnosis not present

## 2021-01-23 DIAGNOSIS — F419 Anxiety disorder, unspecified: Secondary | ICD-10-CM | POA: Diagnosis not present

## 2021-01-23 DIAGNOSIS — G4700 Insomnia, unspecified: Secondary | ICD-10-CM | POA: Diagnosis not present

## 2021-01-23 DIAGNOSIS — K219 Gastro-esophageal reflux disease without esophagitis: Secondary | ICD-10-CM | POA: Diagnosis not present

## 2021-01-23 DIAGNOSIS — E559 Vitamin D deficiency, unspecified: Secondary | ICD-10-CM | POA: Diagnosis not present

## 2021-01-23 DIAGNOSIS — I10 Essential (primary) hypertension: Secondary | ICD-10-CM | POA: Diagnosis not present

## 2021-01-23 DIAGNOSIS — E538 Deficiency of other specified B group vitamins: Secondary | ICD-10-CM | POA: Diagnosis not present

## 2021-01-27 DIAGNOSIS — L03221 Cellulitis of neck: Secondary | ICD-10-CM | POA: Diagnosis not present

## 2021-01-27 DIAGNOSIS — R21 Rash and other nonspecific skin eruption: Secondary | ICD-10-CM | POA: Diagnosis not present

## 2021-05-23 DIAGNOSIS — F419 Anxiety disorder, unspecified: Secondary | ICD-10-CM | POA: Diagnosis not present

## 2021-05-23 DIAGNOSIS — E1140 Type 2 diabetes mellitus with diabetic neuropathy, unspecified: Secondary | ICD-10-CM | POA: Diagnosis not present

## 2021-05-23 DIAGNOSIS — Z6832 Body mass index (BMI) 32.0-32.9, adult: Secondary | ICD-10-CM | POA: Diagnosis not present

## 2021-05-23 DIAGNOSIS — G4700 Insomnia, unspecified: Secondary | ICD-10-CM | POA: Diagnosis not present

## 2021-05-23 DIAGNOSIS — E559 Vitamin D deficiency, unspecified: Secondary | ICD-10-CM | POA: Diagnosis not present

## 2021-05-23 DIAGNOSIS — I10 Essential (primary) hypertension: Secondary | ICD-10-CM | POA: Diagnosis not present

## 2021-05-23 DIAGNOSIS — E1169 Type 2 diabetes mellitus with other specified complication: Secondary | ICD-10-CM | POA: Diagnosis not present

## 2021-05-23 DIAGNOSIS — K219 Gastro-esophageal reflux disease without esophagitis: Secondary | ICD-10-CM | POA: Diagnosis not present

## 2021-05-23 DIAGNOSIS — E785 Hyperlipidemia, unspecified: Secondary | ICD-10-CM | POA: Diagnosis not present

## 2021-06-06 ENCOUNTER — Telehealth: Payer: Self-pay | Admitting: Oncology

## 2021-06-06 NOTE — Telephone Encounter (Signed)
06/06/21 Unable to lve msg.Appt on 07/05/21'@2pm'$  with Dr Hinton Rao ?

## 2021-07-05 ENCOUNTER — Inpatient Hospital Stay: Payer: Medicare HMO | Admitting: Oncology

## 2021-07-26 ENCOUNTER — Ambulatory Visit: Payer: Self-pay | Admitting: Oncology

## 2021-08-14 DIAGNOSIS — Z1231 Encounter for screening mammogram for malignant neoplasm of breast: Secondary | ICD-10-CM | POA: Diagnosis not present

## 2021-08-16 ENCOUNTER — Encounter: Payer: Self-pay | Admitting: Oncology

## 2021-08-17 ENCOUNTER — Encounter: Payer: Self-pay | Admitting: Oncology

## 2021-08-17 ENCOUNTER — Other Ambulatory Visit: Payer: Self-pay | Admitting: Oncology

## 2021-08-17 ENCOUNTER — Inpatient Hospital Stay: Payer: HMO | Attending: Oncology | Admitting: Oncology

## 2021-08-17 VITALS — BP 122/59 | HR 95 | Temp 98.2°F | Resp 16 | Ht 62.0 in | Wt 173.8 lb

## 2021-08-17 DIAGNOSIS — Z1239 Encounter for other screening for malignant neoplasm of breast: Secondary | ICD-10-CM

## 2021-08-17 DIAGNOSIS — D0512 Intraductal carcinoma in situ of left breast: Secondary | ICD-10-CM

## 2021-08-17 DIAGNOSIS — B369 Superficial mycosis, unspecified: Secondary | ICD-10-CM | POA: Diagnosis not present

## 2021-08-17 MED ORDER — NYSTATIN 100000 UNIT/GM EX POWD: 1.0000 | Freq: Three times a day (TID) | CUTANEOUS | 5 refills | Status: AC

## 2021-08-23 DIAGNOSIS — E119 Type 2 diabetes mellitus without complications: Secondary | ICD-10-CM | POA: Diagnosis not present

## 2021-08-23 DIAGNOSIS — H524 Presbyopia: Secondary | ICD-10-CM | POA: Diagnosis not present

## 2021-08-27 DIAGNOSIS — Z9181 History of falling: Secondary | ICD-10-CM | POA: Diagnosis not present

## 2021-08-27 DIAGNOSIS — I10 Essential (primary) hypertension: Secondary | ICD-10-CM | POA: Diagnosis not present

## 2021-08-27 DIAGNOSIS — F419 Anxiety disorder, unspecified: Secondary | ICD-10-CM | POA: Diagnosis not present

## 2021-08-27 DIAGNOSIS — E785 Hyperlipidemia, unspecified: Secondary | ICD-10-CM | POA: Diagnosis not present

## 2021-08-27 DIAGNOSIS — E538 Deficiency of other specified B group vitamins: Secondary | ICD-10-CM | POA: Diagnosis not present

## 2021-08-27 DIAGNOSIS — E559 Vitamin D deficiency, unspecified: Secondary | ICD-10-CM | POA: Diagnosis not present

## 2021-08-27 DIAGNOSIS — E1169 Type 2 diabetes mellitus with other specified complication: Secondary | ICD-10-CM | POA: Diagnosis not present

## 2021-08-27 DIAGNOSIS — G8929 Other chronic pain: Secondary | ICD-10-CM | POA: Diagnosis not present

## 2021-08-27 DIAGNOSIS — G4700 Insomnia, unspecified: Secondary | ICD-10-CM | POA: Diagnosis not present

## 2021-08-27 DIAGNOSIS — Z139 Encounter for screening, unspecified: Secondary | ICD-10-CM | POA: Diagnosis not present

## 2021-08-27 DIAGNOSIS — Z6832 Body mass index (BMI) 32.0-32.9, adult: Secondary | ICD-10-CM | POA: Diagnosis not present

## 2021-08-28 DIAGNOSIS — M722 Plantar fascial fibromatosis: Secondary | ICD-10-CM | POA: Diagnosis not present

## 2021-08-28 DIAGNOSIS — E119 Type 2 diabetes mellitus without complications: Secondary | ICD-10-CM | POA: Diagnosis not present

## 2021-08-28 DIAGNOSIS — E1142 Type 2 diabetes mellitus with diabetic polyneuropathy: Secondary | ICD-10-CM | POA: Diagnosis not present

## 2021-09-11 NOTE — Progress Notes (Signed)
[place] DATE:  08/17/21  Patient Care Team: Earlyne Iba, NP as PCP - General (Nurse Practitioner)  Assessment & Plan   Ductal carcinoma in situ of the left breast It was a stage 0 diagnosed in May 2015 and treated with excisional biopsy.  This was a 14 mm intermediate grade ductal carcinoma in situ with positive estrogen receptors and negative progesterone receptors.  She had radiation and chemoprevention with raloxifene.   I reassured the patient that the rash of her left breast is from yeast and will prescribe Mycostatin powder.  I find no evidence of disease.  I will plan to see her back in 1 year with bilateral mammogram or she can change to the follow-up to her primary care provider.  I discussed the assessment and treatment plan with the patient.  The patient was provided an opportunity to ask questions and all were answered.  The patient agreed with the plan and demonstrated an understanding of the instructions.  The patient was advised to call back if the symptoms worsen or if the condition fails to improve as anticipated.   I provided 30 minutes of face-to-face time during this this encounter and > 50% was spent counseling as documented under my assessment and plan.    Derwood Kaplan, MD [facility] [address] [place] [address] Dept: [phone] Dept Fax: [phone]    CHIEF COMPLAINTS/PURPOSE OF CONSULTATION:  Ductal carcinoma in situ the left breast  HISTORY OF PRESENTING ILLNESS:  Sandra Bryan 71 y.o. female is here because of prior diagnosis of left breast DCIS.  This was diagnosed in May 2015 and treated with excisional biopsy followed by radiotherapy.  Pathology revealed a 1.4 cm intermediate grade ductal carcinoma in situ with positive estrogen receptors and negative progesterone receptors.  After radiotherapy, she was placed on raloxifene for chemoprevention and  took it for some time but stopped because of a unusual odor that she kept complaining of.  This resolved after cessation of the medication.  I had been seeing her for yearly follow-up since she is over 5 years out but her last visit was May 2021.  Her mammogram from May 21, 2019 was negative.    I reviewed her records extensively and collaborated the history with the patient.  SUMMARY OF ONCOLOGIC HISTORY: Oncology History  Ductal carcinoma in situ of left breast  06/04/2013 Cancer Staging   Staging form: Breast, AJCC 7th Edition - Clinical stage from 06/04/2013: Stage 0 (Tis (DCIS), N0, M0) - Signed by Derwood Kaplan, MD on 09/11/2021 Staged by: Managing physician Diagnostic confirmation: Positive histology Specimen type: Excision Histopathologic type: Intraductal carcinoma, noninfiltrating, NOS Stage prefix: Initial diagnosis Laterality: Left Tumor size (mm): 14 Method of lymph node assessment: Clinical Histologic grade (G): G2 Lymph-vascular invasion (LVI): LVI not present (absent)/not identified Residual tumor (R): R0 - None Paget's disease: Negative Tumor grade (Scarff-Bloom-Richardson system): G2 Estrogen receptor status: Positive Progesterone receptor status: Negative HER2 status: Not assessed  Multi-gene signature risk of recurrence: Not assessed Prognostic indicators: Excision, radiation and chemoprevention with raloxifene Stage used in treatment planning: Yes National guidelines used in treatment planning: Yes Type of national guideline used in treatment planning: NCCN   06/11/2013 Initial Diagnosis   Ductal carcinoma in situ of left breast     In terms of breast cancer risk profile:  She menarched at early age of 39 and went to menopause at age 31  She had 3 pregnancy, her first child was born at age 56   She was  never exposed to fertility medications or hormone replacement therapy.  She has no family history of Breast/GYN/GI cancer  INTERVAL HISTORY: I  have not seen Sandra Bryan since May 2021 but she did have her annual mammogram on July 03, 2020 which was clear and she has had her current mammogram on July 25 and once again this is negative.  She was concerned about some changes below her breast on the left side.  She complains of feeling hot all the time.  A bone density scan in June 2021 was normal.  She is on gabapentin now at 800 mg 3 times daily for her back pain.  She does have a history of multilevel lumbar degenerative disc disease and spondylosis as well as disc protrusion and osteoarthritis.  MEDICAL HISTORY:  Past Medical History:  Diagnosis Date   Anxiety    Diabetes mellitus without complication ([place])   Osteoarthritis Degenerative disc disease  SURGICAL HISTORY: Past Surgical History:  Procedure Laterality Date   BACK SURGERY     ELBOW SURGERY     HAND SURGERY    Excisional biopsy of the left breast May 2015  SOCIAL HISTORY: Social History   Socioeconomic History   Marital status: Married    Spouse name: Not on file   Number of children: Not on file   Years of education: Not on file   Highest education level: Not on file  Occupational History   Not on file  Tobacco Use   Smoking status: Never   Smokeless tobacco: Never  Substance and Sexual Activity   Alcohol use: No   Drug use: No   Sexual activity: Not on file  Other Topics Concern   Not on file  Social History Narrative   Not on file   Social Determinants of Health   Financial Resource Strain: Not on file  Food Insecurity: Not on file  Transportation Needs: Not on file  Physical Activity: Not on file  Stress: Not on file  Social Connections: Not on file  Intimate Partner Violence: Not on file    FAMILY HISTORY: Her father had pancreatic cancer at age 26 Her sister had lymphoma of the spine and is a survivor  ALLERGIES:  is allergic to bupropion and hydrocodone-acetaminophen.  MEDICATIONS:  Current Outpatient Medications  Medication Sig  Dispense Refill   nystatin (MYCOSTATIN/NYSTOP) powder Apply 1 Application topically 3 (three) times daily. 15 g 5   ondansetron (ZOFRAN) 4 MG tablet Take by mouth.     atorvastatin (LIPITOR) 20 MG tablet Take by mouth.     gabapentin (NEURONTIN) 800 MG tablet Take 800 mg by mouth 3 (three) times daily.     glipiZIDE (GLUCOTROL XL) 10 MG 24 hr tablet Take 10 mg by mouth daily.     JARDIANCE 25 MG TABS tablet Take 25 mg by mouth daily.     lisinopril (ZESTRIL) 5 MG tablet Take by mouth.     metFORMIN (GLUCOPHAGE-XR) 500 MG 24 hr tablet Take 1,000 mg by mouth 2 (two) times daily.     omeprazole (PRILOSEC) 40 MG capsule Take 40 mg by mouth daily.     venlafaxine (EFFEXOR) 75 MG tablet Take by mouth.     zolpidem (AMBIEN) 5 MG tablet Take 5 mg by mouth at bedtime as needed.     No current facility-administered medications for this visit.    REVIEW OF SYSTEMS:   Constitutional: Denies  fevers, chills or abnormal night sweats Eyes: Denies blurriness of vision, double vision or watery eyes Ears, nose, mouth, throat, and face: Denies mucositis or sore throat Respiratory: Denies cough, dyspnea or wheezes Cardiovascular: Denies palpitation, chest discomfort or lower extremity swelling Gastrointestinal:  Denies nausea, heartburn or change in bowel habits Skin: Denies abnormal skin rashes Lymphatics: Denies new lymphadenopathy or easy bruising Neurological:Denies numbness, tingling or new weaknesses Behavioral/Psych: Mood is stable, no new changes  All other systems were reviewed with the patient and are negative.  PHYSICAL EXAMINATION: ECOG PERFORMANCE STATUS: 1 - Symptomatic but completely ambulatory  Vitals:   08/17/21 1616  BP: (!) 122/59  Pulse: 95  Resp: 16  Temp: 98.2 F (36.8 C)  SpO2: 94%   Filed Weights   08/17/21 1616  Weight: 173 lb 12.8 oz (78.8 kg)    GENERAL:alert, no distress and comfortable SKIN: skin color, texture, turgor are normal, no rashes or significant  lesions EYES: normal, conjunctiva are pink and non-injected, sclera clear OROPHARYNX:no exudate, no erythema and lips, buccal mucosa, and tongue normal  NECK: supple, thyroid normal size, non-tender, without nodularity LYMPH:  no palpable lymphadenopathy in the cervical, axillary or inguinal LUNGS: clear to auscultation and percussion with normal breathing effort HEART: regular rate & rhythm and no murmurs and no lower extremity edema ABDOMEN:abdomen soft, non-tender and normal bowel sounds Musculoskeletal:no cyanosis of digits and no clubbing  PSYCH: alert & oriented x 3 with fluent speech NEURO: no focal motor/sensory deficits BREASTS: No masses in either breast She has a small incision medial to the left nipple which is well-healed She has some mild yeast rash under the left breast.  LABORATORY DATA:  I have reviewed the data as listed Lab Results  Component Value Date   WBC 10.2 07/23/2007   HGB 14.3 07/23/2007   HCT 41.4 07/23/2007   MCV 93.1 07/23/2007   PLT 257 07/23/2007   Lab Results  Component Value Date   NA 137 07/23/2007   K 4.4 07/23/2007   CL 105 07/23/2007   CO2 28 07/23/2007    RADIOGRAPHIC STUDIES: I have personally reviewed the radiological images as listed and agreed with the findings in the report. Her last mammogram is from August 14, 2021 and is negative.

## 2021-11-08 ENCOUNTER — Emergency Department: Admit: 2021-11-08 | Payer: PRIVATE HEALTH INSURANCE | Primary: Diagnostic Radiology

## 2021-11-08 ENCOUNTER — Inpatient Hospital Stay
Admit: 2021-11-08 | Discharge: 2021-11-08 | Disposition: A | Discharge status: Home / Self Care | Payer: Medicare (Managed Care) | Attending: Emergency Medicine

## 2021-11-08 DIAGNOSIS — H8110 Benign paroxysmal vertigo, unspecified ear: Secondary | ICD-10-CM

## 2021-11-08 LAB — CBC WITH AUTO DIFFERENTIAL
Absolute Baso #: 0 10*3/uL (ref 0.0–0.2)
Absolute Eos #: 0.1 10*3/uL (ref 0.0–0.5)
Absolute Lymph #: 2.2 10*3/uL (ref 1.0–3.2)
Absolute Mono #: 0.7 10*3/uL (ref 0.3–1.0)
Basophils %: 0.5 % (ref 0.0–2.0)
Eosinophils %: 1.7 % (ref 0.0–7.0)
Hematocrit: 43.6 % (ref 34.0–47.0)
Hemoglobin: 13.7 g/dL (ref 11.5–15.7)
Immature Grans (Abs): 0.01 10*3/uL (ref 0.00–0.06)
Immature Granulocytes: 0.1 % (ref 0.0–0.6)
Lymphocytes: 28.6 % (ref 15.0–45.0)
MCH: 28.2 pg (ref 27.0–34.5)
MCHC: 31.4 g/dL — ABNORMAL LOW (ref 32.0–36.0)
MCV: 89.9 fL (ref 81.0–99.0)
MPV: 11.1 fL (ref 7.2–13.2)
Monocytes: 8.8 % (ref 4.0–12.0)
Neutrophils %: 60.3 % (ref 42.0–74.0)
Neutrophils Absolute: 4.6 10*3/uL (ref 1.6–7.3)
Platelets: 258 10*3/uL (ref 140–440)
RBC: 4.85 x10e6/mcL (ref 3.60–5.20)
RDW: 14 % (ref 11.0–16.0)
WBC: 7.6 10*3/uL (ref 3.8–10.6)

## 2021-11-08 LAB — BASIC METABOLIC PANEL
Anion Gap: 12 mmol/L (ref 2–17)
BUN: 19 mg/dL (ref 8–23)
CO2: 28 mmol/L (ref 22–29)
Calcium: 10.2 mg/dL (ref 8.8–10.2)
Chloride: 99 mmol/L (ref 98–107)
Creatinine: 0.7 mg/dL (ref 0.5–1.0)
Est, Glom Filt Rate: 92 mL/min/1.73m (ref >=60)
Glucose: 73 mg/dL (ref 70–99)
OSMOLALITY CALCULATED: 278 mOsm/kg (ref 270–287)
Potassium: 4.4 mmol/L (ref 3.5–5.3)
Sodium: 139 mmol/L (ref 135–145)

## 2021-11-08 MED ORDER — SODIUM CHLORIDE 0.9 % IV BOLUS
0.9 % | Freq: Once | INTRAVENOUS | Status: AC
Start: 2021-11-08 — End: 2021-11-08
  Administered 2021-11-08: 21:00:00 1000 mL via INTRAVENOUS

## 2021-11-08 MED ORDER — DIAZEPAM 5 MG PO TABS
5 MG | ORAL_TABLET | Freq: Three times a day (TID) | ORAL | 0 refills | Status: AC | PRN
Start: 2021-11-08 — End: 2021-11-13

## 2021-11-08 MED ORDER — DIAZEPAM 5 MG/ML IJ SOLN
5 MG/ML | Freq: Once | INTRAMUSCULAR | Status: AC
Start: 2021-11-08 — End: 2021-11-08
  Administered 2021-11-08: 21:00:00 2 mg via INTRAVENOUS

## 2021-11-08 MED FILL — DIAZEPAM 5 MG/ML IJ SOLN: 5 MG/ML | INTRAMUSCULAR | Qty: 2

## 2021-11-08 NOTE — ED Provider Notes (Signed)
[facility] EMERGENCY DEPT  EMERGENCY DEPARTMENT ENCOUNTER      Pt Name: Kerri Morris  MRN: [id number]  [place] [birth year]  Date of evaluation: 11/08/2021  Provider: Jaci Standard, MD  Provider evaluation time: 11/08/21 [place]       Chief Complaint   Patient presents with    Dizziness     Pt states she vomited on Thursday, states got on a cruise ship Saturday and then developed nausea and dizziness Sunday, states saw medical on the cruise and still having the symptoms today, states got off of the ship today, still feeling dizzy and nauseated.        HISTORY OF PRESENT ILLNESS    The patient presents to the emergency department with reported room spinning dizziness over the past few days.  The patient states that she was on a cruise the last few days and the movement of the ship exacerbated her symptoms, but that they started prior to departing for her cruise.  She notes that the dizziness occurs when she moves or turns her head and then subsequently resolves until she moves again, causing another exacerbation.  She reports associated nausea and one episode of vomiting.  She denies any vision changes, speech changes, headache, tinnitus, hearing loss, focal numbness/weakness, lightheadedness, or any other associated symptoms.  She has a history of diabetes and hypertension, which she reports are controlled with medication.    The history is provided by the patient.       Nursing Notes were reviewed.    REVIEW OF SYSTEMS     Review of Systems   Constitutional:  Negative for chills and fever.   HENT:  Negative for congestion, ear pain, sinus pressure, sinus pain and tinnitus.    Eyes:  Negative for visual disturbance.   Respiratory:  Negative for shortness of breath.    Cardiovascular:  Negative for chest pain and palpitations.   Gastrointestinal:  Positive for nausea. Negative for abdominal pain.   Genitourinary:  Negative for dysuria.   Musculoskeletal:  Negative for back pain and neck pain.   Skin:   Negative for rash.   Neurological:  Positive for dizziness. Negative for speech difficulty, weakness, light-headedness, numbness and headaches.   All other systems reviewed and are negative.      PAST MEDICAL & SURGICAL HISTORY   No past medical history on file.    No past surgical history on file.    CURRENT MEDICATIONS       Previous Medications    No medications on file       ALLERGIES     Patient has no known allergies.    FAMILY & SOCIAL HISTORY     No family history on file.     Social History     Socioeconomic History    Marital status: Married       PHYSICAL EXAM       ED Triage Vitals [11/08/21 1349]   BP Temp Temp Source Pulse Respirations SpO2 Height Weight - Scale   (!) 150/102 98.2 F (36.8 C) Oral 92 18 98 % 1.575 m (5\' 2" ) 77.6 kg (171 lb)       Physical Exam  Vitals and nursing note reviewed.   Constitutional:       General: She is not in acute distress.     Appearance: Normal appearance. She is not ill-appearing or toxic-appearing.   HENT:      Head: Normocephalic and atraumatic.  Right Ear: Tympanic membrane, ear canal and external ear normal.      Left Ear: Tympanic membrane, ear canal and external ear normal.      Nose: Nose normal.      Mouth/Throat:      Mouth: Mucous membranes are moist.      Pharynx: Oropharynx is clear.      Comments: Airway patent  Eyes:      Extraocular Movements: Extraocular movements intact.      Conjunctiva/sclera: Conjunctivae normal.      Pupils: Pupils are equal, round, and reactive to light.   Cardiovascular:      Rate and Rhythm: Normal rate.      Pulses: Normal pulses.      Heart sounds: Normal heart sounds.      Comments: Normal peripheral perfusion  Pulmonary:      Effort: Pulmonary effort is normal. No respiratory distress.      Breath sounds: Normal breath sounds.   Abdominal:      Tenderness: There is no abdominal tenderness.   Musculoskeletal:         General: No swelling.      Cervical back: Normal range of motion and neck supple. No tenderness.    Skin:     General: Skin is warm and dry.      Capillary Refill: Capillary refill takes less than 2 seconds.   Neurological:      Mental Status: She is alert and oriented to person, place, and time. Mental status is at baseline.      Comments:   Alert and oriented x 3  Cranial nerves:  II - XII intact bilaterally; CN I not assessed  Motor:  5/5 strength throughout all motor groups in the bilateral upper and lower extremities  Sensation:  No significant sensory deficits  Cerebellar:  Normal rapid alternating movements; Normal finger-to-nose; No pronator drift  Gait:  Steady     Psychiatric:         Mood and Affect: Mood normal.         Behavior: Behavior normal.         Thought Content: Thought content normal.         Procedures    DIAGNOSTIC RESULTS     RADIOLOGY:   CT HEAD WO CONTRAST   Final Result   No acute intracranial abnormality.            LABS:  Labs Reviewed   CBC WITH AUTO DIFFERENTIAL - Abnormal; Notable for the following components:       Result Value    MCHC 31.4 (*)     All other components within normal limits   BASIC METABOLIC PANEL       All other labs were within normal range or not returned as of this dictation.    ED COURSE, REASSESSMENT and MDM:          Medical Decision Making  The patient's work-up is reassuring with regard to an acute, emergent underlying etiology of symptoms.  No acute abnormality on labs or head CT.  The patient is feeling better after dose of diazepam in the ED.  Plan for discharge home with diazepam and ENT referral.  I have advised the patient to return to the ED, as needed, for any new or worsening symptoms or further concerns.      Amount and/or Complexity of Data Reviewed  Labs: ordered.  Radiology: ordered.    Risk  Prescription drug management.  FINAL IMPRESSION      1. Benign paroxysmal positional vertigo, unspecified laterality          DISPOSITION/PLAN   DISPOSITION Decision To Discharge 11/08/2021 05:24:24 PM      PATIENT REFERRED TO:  [facility]  [address]  [place] [address]  [phone]    Call to schedule an appt as soon as available      DISCHARGE MEDICATIONS:  New Prescriptions    DIAZEPAM (VALIUM) 5 MG TABLET    Take 1 tablet by mouth every 8 hours as needed (dizziness/vertigo) for up to 5 days. Max Daily Amount: 15 mg       (Please note that portions of this note were completed with a voice recognition transcription program.  Efforts were made to ensure the accuracy of the dictations, but occasionally words are mis-transcribed.)    Gaspar Bidding, MD (electronically signed)  Emergency Medicine       Gaspar Bidding, MD  11/08/21 1729

## 2021-11-08 NOTE — Discharge Instructions (Signed)
Thank you for visiting the Emergency Department today.  Your visit today is just one step in your care.  It is important to follow-up with the specialist for further evaluation and ongoing care.    You have been prescribed diazepam, which is medication that can cause drowsiness.  Please do not drive, operate hazardous equipment, or perform any other possibly dangerous activities within 8 hours of taking a dose.  Also, you will likely have symptom relief with taking only a half tablet every 8 hours, as needed.  Take the lowest dose that successfully treats your symptoms.    Return to the Emergency Department, as needed, for any new/worsening symptoms or other concerns.     If you do not have a primary care physician or need help finding any other type of physician, you may call [phone] for assistance.

## 2022-02-20 DIAGNOSIS — E669 Obesity, unspecified: Secondary | ICD-10-CM | POA: Diagnosis not present

## 2022-02-20 DIAGNOSIS — I10 Essential (primary) hypertension: Secondary | ICD-10-CM | POA: Diagnosis not present

## 2022-02-20 DIAGNOSIS — E559 Vitamin D deficiency, unspecified: Secondary | ICD-10-CM | POA: Diagnosis not present

## 2022-02-20 DIAGNOSIS — E538 Deficiency of other specified B group vitamins: Secondary | ICD-10-CM | POA: Diagnosis not present

## 2022-02-20 DIAGNOSIS — E1165 Type 2 diabetes mellitus with hyperglycemia: Secondary | ICD-10-CM | POA: Diagnosis not present

## 2022-02-20 DIAGNOSIS — Z853 Personal history of malignant neoplasm of breast: Secondary | ICD-10-CM | POA: Diagnosis not present

## 2022-02-20 DIAGNOSIS — E782 Mixed hyperlipidemia: Secondary | ICD-10-CM | POA: Diagnosis not present

## 2022-02-20 DIAGNOSIS — E1142 Type 2 diabetes mellitus with diabetic polyneuropathy: Secondary | ICD-10-CM | POA: Diagnosis not present

## 2022-03-18 DIAGNOSIS — E559 Vitamin D deficiency, unspecified: Secondary | ICD-10-CM | POA: Diagnosis not present

## 2022-03-18 DIAGNOSIS — E1169 Type 2 diabetes mellitus with other specified complication: Secondary | ICD-10-CM | POA: Diagnosis not present

## 2022-03-18 DIAGNOSIS — G4700 Insomnia, unspecified: Secondary | ICD-10-CM | POA: Diagnosis not present

## 2022-03-18 DIAGNOSIS — I10 Essential (primary) hypertension: Secondary | ICD-10-CM | POA: Diagnosis not present

## 2022-03-18 DIAGNOSIS — K5900 Constipation, unspecified: Secondary | ICD-10-CM | POA: Diagnosis not present

## 2022-03-18 DIAGNOSIS — E785 Hyperlipidemia, unspecified: Secondary | ICD-10-CM | POA: Diagnosis not present

## 2022-03-18 DIAGNOSIS — Z6831 Body mass index (BMI) 31.0-31.9, adult: Secondary | ICD-10-CM | POA: Diagnosis not present

## 2022-05-23 DIAGNOSIS — I10 Essential (primary) hypertension: Secondary | ICD-10-CM | POA: Diagnosis not present

## 2022-05-23 DIAGNOSIS — E559 Vitamin D deficiency, unspecified: Secondary | ICD-10-CM | POA: Diagnosis not present

## 2022-05-23 DIAGNOSIS — E1142 Type 2 diabetes mellitus with diabetic polyneuropathy: Secondary | ICD-10-CM | POA: Diagnosis not present

## 2022-05-23 DIAGNOSIS — E782 Mixed hyperlipidemia: Secondary | ICD-10-CM | POA: Diagnosis not present

## 2022-05-23 DIAGNOSIS — E669 Obesity, unspecified: Secondary | ICD-10-CM | POA: Diagnosis not present

## 2022-05-23 DIAGNOSIS — E1165 Type 2 diabetes mellitus with hyperglycemia: Secondary | ICD-10-CM | POA: Diagnosis not present

## 2022-05-30 DIAGNOSIS — E119 Type 2 diabetes mellitus without complications: Secondary | ICD-10-CM | POA: Diagnosis not present

## 2022-07-08 DIAGNOSIS — K219 Gastro-esophageal reflux disease without esophagitis: Secondary | ICD-10-CM | POA: Diagnosis not present

## 2022-07-08 DIAGNOSIS — E538 Deficiency of other specified B group vitamins: Secondary | ICD-10-CM | POA: Diagnosis not present

## 2022-07-08 DIAGNOSIS — G4700 Insomnia, unspecified: Secondary | ICD-10-CM | POA: Diagnosis not present

## 2022-07-08 DIAGNOSIS — E785 Hyperlipidemia, unspecified: Secondary | ICD-10-CM | POA: Diagnosis not present

## 2022-07-08 DIAGNOSIS — I10 Essential (primary) hypertension: Secondary | ICD-10-CM | POA: Diagnosis not present

## 2022-07-08 DIAGNOSIS — E559 Vitamin D deficiency, unspecified: Secondary | ICD-10-CM | POA: Diagnosis not present

## 2022-07-08 DIAGNOSIS — E1169 Type 2 diabetes mellitus with other specified complication: Secondary | ICD-10-CM | POA: Diagnosis not present

## 2022-08-19 DIAGNOSIS — Z1231 Encounter for screening mammogram for malignant neoplasm of breast: Secondary | ICD-10-CM | POA: Diagnosis not present

## 2022-08-21 ENCOUNTER — Ambulatory Visit: Payer: PPO | Admitting: Oncology

## 2022-08-22 ENCOUNTER — Other Ambulatory Visit: Payer: Self-pay | Admitting: Oncology

## 2022-08-22 ENCOUNTER — Inpatient Hospital Stay: Payer: PPO | Attending: Oncology | Admitting: Oncology

## 2022-08-22 VITALS — BP 131/79 | HR 93 | Temp 98.4°F | Resp 16 | Ht 62.0 in | Wt 160.7 lb

## 2022-08-22 DIAGNOSIS — D0512 Intraductal carcinoma in situ of left breast: Secondary | ICD-10-CM

## 2022-08-22 NOTE — Progress Notes (Signed)
[name]  [address] [place],  [place]  [address] [phone]   Patient Care Team: Jim Like, NP as PCP - General (Nurse Practitioner)  DATE: 08/22/22 Assessment:  Ductal carcinoma in situ of the left breast It was a stage 0 diagnosed in May 2015 and treated with excisional biopsy.  This was a 14 mm intermediate grade ductal carcinoma in situ with positive estrogen receptors and negative progesterone receptors.  She had radiation and chemoprevention with raloxifene.   Plan:  She is on [facility] and has been on it for about a month, she tolerates this well without complications. She had a screening bilateral mammogram done on 08/19/2022 that was clear. I will see her back in 1 year for reevaluation and annual screening bilateral mammogram. I discussed the assessment and treatment plan with the patient.  The patient was provided an opportunity to ask questions and all were answered.  The patient agreed with the plan and demonstrated an understanding of the instructions.  The patient was advised to call back if the problems arise regarding her breast cancer.   I provided 15 minutes of face-to-face time during this this encounter and > 50% was spent counseling as documented under my assessment and plan.    [name], MD [facility] [address] [place] [address] Dept: [phone] Dept Fax: [phone]    CHIEF COMPLAINTS/PURPOSE OF CONSULTATION:  Ductal carcinoma in situ the left breast  HISTORY OF PRESENTING ILLNESS:  Sandra Bryan 72 y.o. female is here because of prior diagnosis of left breast DCIS.  This was diagnosed in May 2015 and treated with excisional biopsy followed by radiotherapy.  Pathology revealed a 1.4 cm intermediate grade ductal carcinoma in situ with positive estrogen receptors and negative progesterone receptors.  After  radiotherapy, she was placed on raloxifene for chemoprevention and took it for some time but stopped because of a unusual odor that she kept complaining of.  This resolved after cessation of the medication.  I had been seeing her for yearly follow-up since she is over 5 years out but her last visit was May 2021.  Her mammogram from May 21, 2019 was negative.  I reviewed her records extensively and collaborated the history with the patient.  SUMMARY OF ONCOLOGIC HISTORY: Oncology History  Ductal carcinoma in situ of left breast  06/04/2013 Cancer Staging   Staging form: Breast, AJCC 7th Edition - Clinical stage from 06/04/2013: Stage 0 (Tis (DCIS), N0, M0) - Signed by [name], MD on 09/11/2021 Staged by: Managing physician Diagnostic confirmation: Positive histology Specimen type: Excision Histopathologic type: Intraductal carcinoma, noninfiltrating, NOS Stage prefix: Initial diagnosis Laterality: Left Tumor size (mm): 14 Method of lymph node assessment: Clinical Histologic grade (G): G2 Lymph-vascular invasion (LVI): LVI not present (absent)/not identified Residual tumor (R): R0 - None Paget's disease: Negative Tumor grade (Scarff-Bloom-Richardson system): G2 Estrogen receptor status: Positive Progesterone receptor status: Negative HER2 status: Not assessed  Multi-gene signature risk of recurrence: Not assessed Prognostic indicators: Excision, radiation and chemoprevention with raloxifene Stage used in treatment planning: Yes National guidelines used in treatment planning: Yes Type of national guideline used in treatment planning: NCCN   06/11/2013 Initial Diagnosis   Ductal carcinoma in situ of left breast    In terms of breast cancer risk profile:  She menarched at early age of 67 and went to menopause at age 59  She had 3  pregnancy, her first child was born at age 41   She was  never exposed to fertility medications or hormone replacement therapy.  She has no  family history of Breast/GYN/GI cancer  INTERVAL HISTORY: Comfort is here today for repeat clinical assessment for her ductal carcinoma in situ the left breast. Patient states that she feels well and has no complaints of pain. She is on [facility] and has been on it for about a month, she tolerates this well without complications. She had a screening bilateral mammogram done on 08/19/2022 that was clear. I will see her back in 1 year for reevaluation and annual screening bilateral mammogram. She denies signs of infection such as sore throat, sinus drainage, cough, or urinary symptoms.  She denies fevers or recurrent chills. She denies pain. She denies nausea, vomiting, chest pain, dyspnea or cough. Her appetite is ok as she is now on Mounjaro and her weight has decreased 13 pounds over last year .  MEDICAL HISTORY:  Past Medical History:  Diagnosis Date   Anxiety    Diabetes mellitus without complication (HCC)   Osteoarthritis Degenerative disc disease  SURGICAL HISTORY: Past Surgical History:  Procedure Laterality Date   BACK SURGERY     ELBOW SURGERY     HAND SURGERY    Excisional biopsy of the left breast May 2015  SOCIAL HISTORY: Social History   Socioeconomic History   Marital status: Married    Spouse name: Not on file   Number of children: Not on file   Years of education: Not on file   Highest education level: Not on file  Occupational History   Not on file  Tobacco Use   Smoking status: Never   Smokeless tobacco: Never  Substance and Sexual Activity   Alcohol use: No   Drug use: No   Sexual activity: Not on file  Other Topics Concern   Not on file  Social History Narrative   Not on file   Social Determinants of Health   Financial Resource Strain: Not on file  Food Insecurity: Not on file  Transportation Needs: Not on file  Physical Activity: Not on file  Stress: Not on file  Social Connections: Not on file  Intimate Partner Violence: Not on file    FAMILY  HISTORY: Her father had pancreatic cancer at age 59 Her sister had lymphoma of the spine and is a survivor  ALLERGIES:  is allergic to bupropion and hydrocodone-acetaminophen.  MEDICATIONS:  Current Outpatient Medications  Medication Sig Dispense Refill   atorvastatin (LIPITOR) 20 MG tablet Take by mouth.     gabapentin (NEURONTIN) 800 MG tablet Take 800 mg by mouth 3 (three) times daily.     glipiZIDE (GLUCOTROL XL) 10 MG 24 hr tablet Take 10 mg by mouth daily.     JARDIANCE 25 MG TABS tablet Take 25 mg by mouth daily.     lisinopril (ZESTRIL) 5 MG tablet Take by mouth.     metFORMIN (GLUCOPHAGE-XR) 500 MG 24 hr tablet Take 1,000 mg by mouth 2 (two) times daily.     nystatin (MYCOSTATIN/NYSTOP) powder Apply 1 Application topically 3 (three) times daily. 15 g 5   omeprazole (PRILOSEC) 40 MG capsule Take 40 mg by mouth daily.     ondansetron (ZOFRAN) 4 MG tablet Take by mouth.     tirzepatide (MOUNJARO) 5 MG/0.5ML Pen Inject 5 mg into the skin once a week.     venlafaxine (EFFEXOR) 75 MG tablet Take by mouth.  zolpidem (AMBIEN) 5 MG tablet Take 5 mg by mouth at bedtime as needed.     No current facility-administered medications for this visit.   REVIEW OF SYSTEMS:   Review of Systems  Constitutional: Negative.  Negative for appetite change, chills, diaphoresis, fatigue, fever and unexpected weight change.  HENT:  Negative.  Negative for hearing loss, lump/mass, mouth sores, nosebleeds, sore throat, tinnitus, trouble swallowing and voice change.   Eyes: Negative.  Negative for eye problems and icterus.  Respiratory: Negative.  Negative for chest tightness, cough, hemoptysis, shortness of breath and wheezing.   Cardiovascular: Negative.  Negative for chest pain, leg swelling and palpitations.  Gastrointestinal: Negative.  Negative for abdominal distention, abdominal pain, blood in stool, constipation, diarrhea, nausea, rectal pain and vomiting.  Endocrine: Negative.   Genitourinary:  Negative.  Negative for bladder incontinence, difficulty urinating, dyspareunia, dysuria, frequency, hematuria, menstrual problem, nocturia, pelvic pain, vaginal bleeding and vaginal discharge.   Musculoskeletal: Negative.  Negative for arthralgias, back pain, flank pain, gait problem, myalgias, neck pain and neck stiffness.  Skin: Negative.  Negative for itching, rash and wound.  Neurological:  Negative for dizziness, extremity weakness, gait problem, headaches, light-headedness, numbness, seizures and speech difficulty.  Hematological: Negative.  Negative for adenopathy. Does not bruise/bleed easily.  Psychiatric/Behavioral: Negative.  Negative for confusion, decreased concentration, depression, sleep disturbance and suicidal ideas. The patient is not nervous/anxious.    PHYSICAL EXAMINATION: ECOG PERFORMANCE STATUS: 1 - Symptomatic but completely ambulatory Physical Exam Vitals and nursing note reviewed.  Constitutional:      General: She is not in acute distress.    Appearance: Normal appearance. She is normal weight. She is not ill-appearing, toxic-appearing or diaphoretic.  HENT:     Head: Normocephalic and atraumatic.     Right Ear: Tympanic membrane, ear canal and external ear normal. There is no impacted cerumen.     Left Ear: Tympanic membrane, ear canal and external ear normal. There is no impacted cerumen.     Nose: Nose normal. No congestion or rhinorrhea.     Mouth/Throat:     Mouth: Mucous membranes are moist.     Pharynx: Oropharynx is clear. No oropharyngeal exudate or posterior oropharyngeal erythema.  Eyes:     General: No scleral icterus.       Right eye: No discharge.        Left eye: No discharge.     Extraocular Movements: Extraocular movements intact.     Conjunctiva/sclera: Conjunctivae normal.     Pupils: Pupils are equal, round, and reactive to light.  Neck:     Vascular: No carotid bruit.  Cardiovascular:     Rate and Rhythm: Normal rate and regular rhythm.      Pulses: Normal pulses.     Heart sounds: Normal heart sounds. No murmur heard.    No friction rub. No gallop.  Pulmonary:     Effort: Pulmonary effort is normal. No respiratory distress.     Breath sounds: Normal breath sounds. No stridor. No wheezing, rhonchi or rales.  Chest:     Chest wall: No tenderness.     Comments: Left  breast has a scar in the lower inner quadrant which is well healed.  No masses in either breast.  Abdominal:     General: Bowel sounds are normal. There is no distension.     Palpations: Abdomen is soft. There is no hepatomegaly, splenomegaly or mass.     Tenderness: There is no abdominal tenderness. There is no right  CVA tenderness, left CVA tenderness, guarding or rebound.     Hernia: No hernia is present.  Musculoskeletal:        General: No swelling, tenderness, deformity or signs of injury. Normal range of motion.     Cervical back: Normal range of motion and neck supple. No rigidity or tenderness.     Right lower leg: No edema.     Left lower leg: No edema.  Lymphadenopathy:     Cervical: No cervical adenopathy.     Right cervical: No superficial, deep or posterior cervical adenopathy.    Left cervical: No superficial, deep or posterior cervical adenopathy.     Upper Body:     Right upper body: No supraclavicular, axillary or pectoral adenopathy.     Left upper body: No supraclavicular, axillary or pectoral adenopathy.  Skin:    General: Skin is warm and dry.     Coloration: Skin is not jaundiced or pale.     Findings: No bruising, erythema, lesion or rash.  Neurological:     General: No focal deficit present.     Mental Status: She is alert and oriented to person, place, and time. Mental status is at baseline.     Cranial Nerves: No cranial nerve deficit.     Sensory: No sensory deficit.     Motor: No weakness.     Coordination: Coordination normal.     Gait: Gait normal.     Deep Tendon Reflexes: Reflexes normal.  Psychiatric:        Mood  and Affect: Mood normal.        Behavior: Behavior normal.        Thought Content: Thought content normal.        Judgment: Judgment normal.    LABORATORY DATA:  I have reviewed the data as listed Lab Results  Component Value Date   WBC 10.2 07/23/2007   HGB 14.3 07/23/2007   HCT 41.4 07/23/2007   MCV 93.1 07/23/2007   PLT 257 07/23/2007   Lab Results  Component Value Date   NA 137 07/23/2007   K 4.4 07/23/2007   CL 105 07/23/2007   CO2 28 07/23/2007    RADIOGRAPHIC STUDIES: I have personally reviewed the radiological images as listed and agreed with the findings in the report.     I,Jasmine M Lassiter,acting as a scribe for [name], MD.,have documented all relevant documentation on the behalf of [name], MD,as directed by  [name], MD while in the presence of [name], MD.

## 2022-09-02 ENCOUNTER — Encounter: Payer: Self-pay | Admitting: Oncology

## 2022-09-13 ENCOUNTER — Encounter: Payer: Self-pay | Admitting: Oncology

## 2022-10-09 DIAGNOSIS — K219 Gastro-esophageal reflux disease without esophagitis: Secondary | ICD-10-CM | POA: Diagnosis not present

## 2022-10-09 DIAGNOSIS — E538 Deficiency of other specified B group vitamins: Secondary | ICD-10-CM | POA: Diagnosis not present

## 2022-10-09 DIAGNOSIS — M1990 Unspecified osteoarthritis, unspecified site: Secondary | ICD-10-CM | POA: Diagnosis not present

## 2022-10-09 DIAGNOSIS — E785 Hyperlipidemia, unspecified: Secondary | ICD-10-CM | POA: Diagnosis not present

## 2022-10-09 DIAGNOSIS — E1169 Type 2 diabetes mellitus with other specified complication: Secondary | ICD-10-CM | POA: Diagnosis not present

## 2022-10-09 DIAGNOSIS — I10 Essential (primary) hypertension: Secondary | ICD-10-CM | POA: Diagnosis not present

## 2022-10-09 DIAGNOSIS — Z6827 Body mass index (BMI) 27.0-27.9, adult: Secondary | ICD-10-CM | POA: Diagnosis not present

## 2022-10-09 DIAGNOSIS — M79602 Pain in left arm: Secondary | ICD-10-CM | POA: Diagnosis not present

## 2022-10-09 DIAGNOSIS — E559 Vitamin D deficiency, unspecified: Secondary | ICD-10-CM | POA: Diagnosis not present

## 2022-10-11 DIAGNOSIS — S46012A Strain of muscle(s) and tendon(s) of the rotator cuff of left shoulder, initial encounter: Secondary | ICD-10-CM | POA: Diagnosis not present

## 2022-10-28 DIAGNOSIS — M79602 Pain in left arm: Secondary | ICD-10-CM | POA: Diagnosis not present

## 2022-10-28 DIAGNOSIS — S46012A Strain of muscle(s) and tendon(s) of the rotator cuff of left shoulder, initial encounter: Secondary | ICD-10-CM | POA: Diagnosis not present

## 2022-12-23 DIAGNOSIS — S46012A Strain of muscle(s) and tendon(s) of the rotator cuff of left shoulder, initial encounter: Secondary | ICD-10-CM | POA: Diagnosis not present

## 2022-12-24 DIAGNOSIS — M546 Pain in thoracic spine: Secondary | ICD-10-CM | POA: Diagnosis not present

## 2022-12-24 DIAGNOSIS — M5412 Radiculopathy, cervical region: Secondary | ICD-10-CM | POA: Diagnosis not present

## 2023-01-08 DIAGNOSIS — E860 Dehydration: Secondary | ICD-10-CM | POA: Diagnosis not present

## 2023-01-08 DIAGNOSIS — E1165 Type 2 diabetes mellitus with hyperglycemia: Secondary | ICD-10-CM | POA: Diagnosis not present

## 2023-01-08 DIAGNOSIS — N3000 Acute cystitis without hematuria: Secondary | ICD-10-CM | POA: Diagnosis not present

## 2023-01-08 DIAGNOSIS — M5116 Intervertebral disc disorders with radiculopathy, lumbar region: Secondary | ICD-10-CM | POA: Diagnosis not present

## 2023-01-08 DIAGNOSIS — Z981 Arthrodesis status: Secondary | ICD-10-CM | POA: Diagnosis not present

## 2023-01-08 DIAGNOSIS — M4726 Other spondylosis with radiculopathy, lumbar region: Secondary | ICD-10-CM | POA: Diagnosis not present

## 2023-01-08 DIAGNOSIS — M48061 Spinal stenosis, lumbar region without neurogenic claudication: Secondary | ICD-10-CM | POA: Diagnosis not present

## 2023-01-08 DIAGNOSIS — E119 Type 2 diabetes mellitus without complications: Secondary | ICD-10-CM | POA: Diagnosis not present

## 2023-01-08 DIAGNOSIS — M5412 Radiculopathy, cervical region: Secondary | ICD-10-CM | POA: Diagnosis not present

## 2023-01-13 DIAGNOSIS — Z139 Encounter for screening, unspecified: Secondary | ICD-10-CM | POA: Diagnosis not present

## 2023-01-13 DIAGNOSIS — F419 Anxiety disorder, unspecified: Secondary | ICD-10-CM | POA: Diagnosis not present

## 2023-01-13 DIAGNOSIS — Z6826 Body mass index (BMI) 26.0-26.9, adult: Secondary | ICD-10-CM | POA: Diagnosis not present

## 2023-01-13 DIAGNOSIS — G4700 Insomnia, unspecified: Secondary | ICD-10-CM | POA: Diagnosis not present

## 2023-01-13 DIAGNOSIS — I10 Essential (primary) hypertension: Secondary | ICD-10-CM | POA: Diagnosis not present

## 2023-01-13 DIAGNOSIS — E1169 Type 2 diabetes mellitus with other specified complication: Secondary | ICD-10-CM | POA: Diagnosis not present

## 2023-01-13 DIAGNOSIS — Z9181 History of falling: Secondary | ICD-10-CM | POA: Diagnosis not present

## 2023-01-13 DIAGNOSIS — E785 Hyperlipidemia, unspecified: Secondary | ICD-10-CM | POA: Diagnosis not present

## 2023-01-25 DIAGNOSIS — F17210 Nicotine dependence, cigarettes, uncomplicated: Secondary | ICD-10-CM | POA: Diagnosis not present

## 2023-01-25 DIAGNOSIS — N390 Urinary tract infection, site not specified: Secondary | ICD-10-CM | POA: Diagnosis not present

## 2023-01-25 DIAGNOSIS — M791 Myalgia, unspecified site: Secondary | ICD-10-CM | POA: Diagnosis not present

## 2023-01-25 DIAGNOSIS — M2550 Pain in unspecified joint: Secondary | ICD-10-CM | POA: Diagnosis not present

## 2023-08-26 ENCOUNTER — Inpatient Hospital Stay: Payer: PPO | Attending: Oncology | Admitting: Oncology

## 2023-08-26 NOTE — Progress Notes (Incomplete)
 [facility]  [address] [place],  [place]  [address] [phone]   Patient Care Team: Pandora Therisa RAMAN, NP as PCP - General (Nurse Practitioner)  DATE: 08/26/23   Assessment:  Ductal carcinoma in situ of the left breast It was a stage 0 diagnosed in May 2015 and treated with excisional biopsy.  This was a 14 mm intermediate grade ductal carcinoma in situ with positive estrogen receptors and negative progesterone receptors.  She had radiation and chemoprevention with raloxifene.  Plan: She is on Mounjaro and has been on it for about a month, she tolerates this well without complications. She had a screening bilateral mammogram done on 08/19/2022 that was clear. I will see her back in 1 year for reevaluation and annual screening bilateral mammogram. I discussed the assessment and treatment plan with the patient.  The patient was provided an opportunity to ask questions and all were answered.  The patient agreed with the plan and demonstrated an understanding of the instructions.  The patient was advised to call back if the problems arise regarding her breast cancer.  I provided 15 minutes of face-to-face time during this this encounter and > 50% was spent counseling as documented under my assessment and plan.   Wanda VEAR Cornish, MD  [facility] - A DEPT OF [facility] [address] [place] [address] Dept: [phone] Dept Fax: [phone]   CHIEF COMPLAINTS/PURPOSE OF CONSULTATION:  CC: Ductal carcinoma in situ the left breast  TREATMENT:  HISTORY OF PRESENTING ILLNESS:  Sandra Bryan 73 y.o. female is here because of prior diagnosis of left breast DCIS.  This was diagnosed in May 2015 and treated with excisional biopsy followed by radiotherapy.  Pathology revealed a 1.4 cm intermediate grade ductal carcinoma in situ with positive estrogen receptors and negative progesterone receptors.   After radiotherapy, she was placed on raloxifene for chemoprevention and took it for some time but stopped because of a unusual odor that she kept complaining of.  This resolved after cessation of the medication.  I had been seeing her for yearly follow-up since she is over 5 years out but her last visit was May 2021.  Her mammogram from May 21, 2019 was negative.  I reviewed her records extensively and collaborated the history with the patient.  SUMMARY OF ONCOLOGIC HISTORY: Oncology History  Ductal carcinoma in situ of left breast  06/04/2013 Cancer Staging   Staging form: Breast, AJCC 7th Edition - Clinical stage from 06/04/2013: Stage 0 (Tis (DCIS), N0, M0) - Signed by Cornish Wanda VEAR, MD on 09/11/2021 Staged by: Managing physician Diagnostic confirmation: Positive histology Specimen type: Excision Histopathologic type: Intraductal carcinoma, noninfiltrating, NOS Stage prefix: Initial diagnosis Laterality: Left Tumor size (mm): 14 Method of lymph node assessment: Clinical Histologic grade (G): G2 Lymph-vascular invasion (LVI): LVI not present (absent)/not identified Residual tumor (R): R0 - None Paget's disease: Negative Tumor grade (Scarff-Bloom-Richardson system): G2 Estrogen receptor status: Positive Progesterone receptor status: Negative HER2 status: Not assessed  Multi-gene signature risk of recurrence: Not assessed Prognostic indicators: Excision, radiation and chemoprevention with raloxifene Stage used in treatment planning: Yes National guidelines used in treatment planning: Yes Type of national guideline used in treatment planning: NCCN   06/11/2013 Initial Diagnosis   Ductal carcinoma in situ of left breast    In terms of breast cancer risk profile:  She menarched at early age of 68 and went to menopause at  age 31  She had 3 pregnancy, her first child was born at age 77   She was  never exposed to fertility medications or hormone replacement therapy.  She has  no family history of Breast/GYN/GI cancer  INTERVAL HISTORY: Veronica is here today for repeat clinical assessment for her ductal carcinoma in situ of the left breast. Patient states that she feels *** and ***.      She denies fever, chills, night sweats, or other signs of infection. She denies cardiorespiratory and gastrointestinal issues. She  denies pain. Her appetite is *** and Her weight {Weight change:10426}.  Patient states that she feels well and has no complaints of pain. She is on Mounjaro and has been on it for about a month, she tolerates this well without complications. She had a screening bilateral mammogram done on 08/19/2022 that was clear. I will see her back in 1 year for reevaluation and annual screening bilateral mammogram. She denies signs of infection such as sore throat, sinus drainage, cough, or urinary symptoms.  She denies fevers or recurrent chills. She denies pain. She denies nausea, vomiting, chest pain, dyspnea or cough. Her appetite is ok as she is now on Mounjaro and her weight has decreased 13 pounds over last year.  MEDICAL HISTORY:  Past Medical History:  Diagnosis Date   Anxiety    Diabetes mellitus without complication (HCC)   Osteoarthritis Degenerative disc disease  SURGICAL HISTORY: Past Surgical History:  Procedure Laterality Date   BACK SURGERY     ELBOW SURGERY     HAND SURGERY    Excisional biopsy of the left breast May 2015  SOCIAL HISTORY: Social History   Socioeconomic History   Marital status: Married    Spouse name: Not on file   Number of children: Not on file   Years of education: Not on file   Highest education level: Not on file  Occupational History   Not on file  Tobacco Use   Smoking status: Never   Smokeless tobacco: Never  Substance and Sexual Activity   Alcohol use: No   Drug use: No   Sexual activity: Not on file  Other Topics Concern   Not on file  Social History Narrative   Not on file   Social Drivers of  Health   Financial Resource Strain: Not on file  Food Insecurity: Not on file  Transportation Needs: Not on file  Physical Activity: Not on file  Stress: Not on file  Social Connections: Not on file  Intimate Partner Violence: Not on file    FAMILY HISTORY: Her father had pancreatic cancer at age 25 Her sister had lymphoma of the spine and is a survivor  ALLERGIES:  is allergic to bupropion and hydrocodone-acetaminophen.  MEDICATIONS:  Current Outpatient Medications  Medication Sig Dispense Refill   atorvastatin (LIPITOR) 20 MG tablet Take by mouth.     gabapentin (NEURONTIN) 800 MG tablet Take 800 mg by mouth 3 (three) times daily.     glipiZIDE (GLUCOTROL XL) 10 MG 24 hr tablet Take 10 mg by mouth daily.     JARDIANCE 25 MG TABS tablet Take 25 mg by mouth daily.     lisinopril (ZESTRIL) 5 MG tablet Take by mouth.     metFORMIN (GLUCOPHAGE-XR) 500 MG 24 hr tablet Take 1,000 mg by mouth 2 (two) times daily.     nystatin  (MYCOSTATIN /NYSTOP ) powder Apply 1 Application topically 3 (three) times daily. 15 g 5   omeprazole (PRILOSEC) 40 MG capsule  Take 40 mg by mouth daily.     ondansetron  (ZOFRAN ) 4 MG tablet Take by mouth.     tirzepatide (MOUNJARO) 5 MG/0.5ML Pen Inject 5 mg into the skin once a week.     venlafaxine (EFFEXOR) 75 MG tablet Take by mouth.     zolpidem (AMBIEN) 5 MG tablet Take 5 mg by mouth at bedtime as needed.     No current facility-administered medications for this visit.   REVIEW OF SYSTEMS:   Review of Systems  Constitutional: Negative.  Negative for appetite change, chills, diaphoresis, fatigue, fever and unexpected weight change.  HENT:  Negative.  Negative for hearing loss, lump/mass, mouth sores, nosebleeds, sore throat, tinnitus, trouble swallowing and voice change.   Eyes: Negative.  Negative for eye problems and icterus.  Respiratory: Negative.  Negative for chest tightness, cough, hemoptysis, shortness of breath and wheezing.   Cardiovascular:  Negative.  Negative for chest pain, leg swelling and palpitations.  Gastrointestinal: Negative.  Negative for abdominal distention, abdominal pain, blood in stool, constipation, diarrhea, nausea, rectal pain and vomiting.  Endocrine: Negative.   Genitourinary: Negative.  Negative for bladder incontinence, difficulty urinating, dyspareunia, dysuria, frequency, hematuria, menstrual problem, nocturia, pelvic pain, vaginal bleeding and vaginal discharge.   Musculoskeletal: Negative.  Negative for arthralgias, back pain, flank pain, gait problem, myalgias, neck pain and neck stiffness.  Skin: Negative.  Negative for itching, rash and wound.  Neurological:  Negative for dizziness, extremity weakness, gait problem, headaches, light-headedness, numbness, seizures and speech difficulty.  Hematological: Negative.  Negative for adenopathy. Does not bruise/bleed easily.  Psychiatric/Behavioral: Negative.  Negative for confusion, decreased concentration, depression, sleep disturbance and suicidal ideas. The patient is not nervous/anxious.    PHYSICAL EXAMINATION: ECOG PERFORMANCE STATUS: 1 - Symptomatic but completely ambulatory Physical Exam Vitals and nursing note reviewed.  Constitutional:      General: She is not in acute distress.    Appearance: Normal appearance. She is normal weight. She is not ill-appearing, toxic-appearing or diaphoretic.  HENT:     Head: Normocephalic and atraumatic.     Right Ear: Tympanic membrane, ear canal and external ear normal. There is no impacted cerumen.     Left Ear: Tympanic membrane, ear canal and external ear normal. There is no impacted cerumen.     Nose: Nose normal. No congestion or rhinorrhea.     Mouth/Throat:     Mouth: Mucous membranes are moist.     Pharynx: Oropharynx is clear. No oropharyngeal exudate or posterior oropharyngeal erythema.  Eyes:     General: No scleral icterus.       Right eye: No discharge.        Left eye: No discharge.      Extraocular Movements: Extraocular movements intact.     Conjunctiva/sclera: Conjunctivae normal.     Pupils: Pupils are equal, round, and reactive to light.  Neck:     Vascular: No carotid bruit.  Cardiovascular:     Rate and Rhythm: Normal rate and regular rhythm.     Pulses: Normal pulses.     Heart sounds: Normal heart sounds. No murmur heard.    No friction rub. No gallop.  Pulmonary:     Effort: Pulmonary effort is normal. No respiratory distress.     Breath sounds: Normal breath sounds. No stridor. No wheezing, rhonchi or rales.  Chest:     Chest wall: No tenderness.     Comments: Left  breast has a scar in the lower inner quadrant which is  well healed.  No masses in either breast.  Abdominal:     General: Bowel sounds are normal. There is no distension.     Palpations: Abdomen is soft. There is no hepatomegaly, splenomegaly or mass.     Tenderness: There is no abdominal tenderness. There is no right CVA tenderness, left CVA tenderness, guarding or rebound.     Hernia: No hernia is present.  Musculoskeletal:        General: No swelling, tenderness, deformity or signs of injury. Normal range of motion.     Cervical back: Normal range of motion and neck supple. No rigidity or tenderness.     Right lower leg: No edema.     Left lower leg: No edema.  Lymphadenopathy:     Cervical: No cervical adenopathy.     Right cervical: No superficial, deep or posterior cervical adenopathy.    Left cervical: No superficial, deep or posterior cervical adenopathy.     Upper Body:     Right upper body: No supraclavicular, axillary or pectoral adenopathy.     Left upper body: No supraclavicular, axillary or pectoral adenopathy.  Skin:    General: Skin is warm and dry.     Coloration: Skin is not jaundiced or pale.     Findings: No bruising, erythema, lesion or rash.  Neurological:     General: No focal deficit present.     Mental Status: She is alert and oriented to person, place, and time.  Mental status is at baseline.     Cranial Nerves: No cranial nerve deficit.     Sensory: No sensory deficit.     Motor: No weakness.     Coordination: Coordination normal.     Gait: Gait normal.     Deep Tendon Reflexes: Reflexes normal.  Psychiatric:        Mood and Affect: Mood normal.        Behavior: Behavior normal.        Thought Content: Thought content normal.        Judgment: Judgment normal.    LABORATORY DATA:  I have reviewed the data as listed Lab Results  Component Value Date   WBC 10.2 07/23/2007   HGB 14.3 07/23/2007   HCT 41.4 07/23/2007   MCV 93.1 07/23/2007   PLT 257 07/23/2007   Lab Results  Component Value Date   NA 137 07/23/2007   K 4.4 07/23/2007   CL 105 07/23/2007   CO2 28 07/23/2007   RADIOGRAPHIC STUDIES: I have personally reviewed the radiological images as listed and agreed with the findings in the report.      I,Jasmine M Lassiter,acting as a scribe for Wanda VEAR Cornish, MD.,have documented all relevant documentation on the behalf of Wanda VEAR Cornish, MD,as directed by  Wanda VEAR Cornish, MD while in the presence of Wanda VEAR Cornish, MD.
